# Patient Record
Sex: Female | Born: 1955 | Race: White | Hispanic: No | Marital: Married | Smoking: Never smoker
Health system: Southern US, Community
[De-identification: ages and names within clinical notes are randomized; demographics above are authoritative.]

## PROBLEM LIST (undated history)

## (undated) DIAGNOSIS — M67911 Unspecified disorder of synovium and tendon, right shoulder: Secondary | ICD-10-CM

## (undated) DIAGNOSIS — R5383 Other fatigue: Secondary | ICD-10-CM

## (undated) DIAGNOSIS — I10 Essential (primary) hypertension: Secondary | ICD-10-CM

## (undated) DIAGNOSIS — M1990 Unspecified osteoarthritis, unspecified site: Secondary | ICD-10-CM

## (undated) DIAGNOSIS — M549 Dorsalgia, unspecified: Secondary | ICD-10-CM

## (undated) DIAGNOSIS — G4700 Insomnia, unspecified: Secondary | ICD-10-CM

## (undated) DIAGNOSIS — F329 Major depressive disorder, single episode, unspecified: Secondary | ICD-10-CM

## (undated) DIAGNOSIS — M797 Fibromyalgia: Secondary | ICD-10-CM

## (undated) DIAGNOSIS — F419 Anxiety disorder, unspecified: Secondary | ICD-10-CM

## (undated) DIAGNOSIS — K469 Unspecified abdominal hernia without obstruction or gangrene: Secondary | ICD-10-CM

## (undated) DIAGNOSIS — M5136 Other intervertebral disc degeneration, lumbar region: Secondary | ICD-10-CM

## (undated) DIAGNOSIS — M75100 Unspecified rotator cuff tear or rupture of unspecified shoulder, not specified as traumatic: Secondary | ICD-10-CM

## (undated) DIAGNOSIS — M5030 Other cervical disc degeneration, unspecified cervical region: Secondary | ICD-10-CM

## (undated) DIAGNOSIS — F32A Depression, unspecified: Secondary | ICD-10-CM

## (undated) HISTORY — DX: Unspecified disorder of synovium and tendon, right shoulder: M67.911

## (undated) HISTORY — DX: Other fatigue: R53.83

## (undated) HISTORY — DX: Other cervical disc degeneration, unspecified cervical region: M50.30

## (undated) HISTORY — PX: HERNIA REPAIR: SHX51

## (undated) HISTORY — DX: Insomnia, unspecified: G47.00

## (undated) HISTORY — DX: Fibromyalgia: M79.7

## (undated) HISTORY — DX: Other intervertebral disc degeneration, lumbar region: M51.36

## (undated) HISTORY — PX: ABDOMINAL HYSTERECTOMY: SHX81

---

## 1987-01-16 HISTORY — PX: BACK SURGERY: SHX140

## 1997-12-23 ENCOUNTER — Other Ambulatory Visit: Admission: RE | Admit: 1997-12-23 | Discharge: 1997-12-23 | Payer: Self-pay | Admitting: Obstetrics and Gynecology

## 1999-06-06 ENCOUNTER — Other Ambulatory Visit: Admission: RE | Admit: 1999-06-06 | Discharge: 1999-06-06 | Payer: Self-pay | Admitting: Obstetrics and Gynecology

## 2001-01-15 HISTORY — PX: GASTRIC BYPASS: SHX52

## 2002-12-15 ENCOUNTER — Ambulatory Visit (HOSPITAL_COMMUNITY): Admission: RE | Admit: 2002-12-15 | Discharge: 2002-12-15 | Payer: Self-pay | Admitting: Unknown Physician Specialty

## 2006-03-26 ENCOUNTER — Ambulatory Visit (HOSPITAL_COMMUNITY): Admission: RE | Admit: 2006-03-26 | Discharge: 2006-03-26 | Payer: Self-pay | Admitting: Unknown Physician Specialty

## 2008-01-16 HISTORY — PX: NECK SURGERY: SHX720

## 2008-02-24 ENCOUNTER — Ambulatory Visit (HOSPITAL_COMMUNITY): Admission: RE | Admit: 2008-02-24 | Discharge: 2008-02-24 | Payer: Self-pay | Admitting: Obstetrics and Gynecology

## 2008-07-02 ENCOUNTER — Inpatient Hospital Stay (HOSPITAL_COMMUNITY): Admission: RE | Admit: 2008-07-02 | Discharge: 2008-07-04 | Payer: Self-pay | Admitting: Neurosurgery

## 2009-07-01 ENCOUNTER — Ambulatory Visit (HOSPITAL_COMMUNITY): Admission: RE | Admit: 2009-07-01 | Discharge: 2009-07-01 | Payer: Self-pay | Admitting: Internal Medicine

## 2009-08-31 ENCOUNTER — Encounter: Admission: RE | Admit: 2009-08-31 | Discharge: 2009-08-31 | Payer: Self-pay | Admitting: Neurosurgery

## 2010-02-04 ENCOUNTER — Encounter: Payer: Self-pay | Admitting: Unknown Physician Specialty

## 2010-03-31 ENCOUNTER — Other Ambulatory Visit (HOSPITAL_COMMUNITY): Payer: Self-pay | Admitting: Surgery

## 2010-03-31 ENCOUNTER — Encounter (HOSPITAL_COMMUNITY): Payer: 59

## 2010-03-31 ENCOUNTER — Other Ambulatory Visit: Payer: Self-pay | Admitting: Surgery

## 2010-03-31 ENCOUNTER — Ambulatory Visit (HOSPITAL_COMMUNITY)
Admission: RE | Admit: 2010-03-31 | Discharge: 2010-03-31 | Disposition: A | Discharge status: Home / Self Care | Payer: 59 | Source: Ambulatory Visit | Attending: Surgery | Admitting: Surgery

## 2010-03-31 DIAGNOSIS — Z01810 Encounter for preprocedural cardiovascular examination: Secondary | ICD-10-CM | POA: Insufficient documentation

## 2010-03-31 DIAGNOSIS — Z981 Arthrodesis status: Secondary | ICD-10-CM | POA: Insufficient documentation

## 2010-03-31 DIAGNOSIS — Z01812 Encounter for preprocedural laboratory examination: Secondary | ICD-10-CM | POA: Insufficient documentation

## 2010-03-31 DIAGNOSIS — Z01818 Encounter for other preprocedural examination: Secondary | ICD-10-CM | POA: Insufficient documentation

## 2010-03-31 DIAGNOSIS — K439 Ventral hernia without obstruction or gangrene: Secondary | ICD-10-CM | POA: Insufficient documentation

## 2010-03-31 LAB — BASIC METABOLIC PANEL
BUN: 10 mg/dL (ref 6–23)
CO2: 31 mEq/L (ref 19–32)
Calcium: 9.6 mg/dL (ref 8.4–10.5)
Chloride: 103 mEq/L (ref 96–112)
Creatinine, Ser: 0.89 mg/dL (ref 0.4–1.2)
GFR calc Af Amer: >60 mL/min (ref >60)
GFR calc non Af Amer: >60 mL/min (ref >60)
Glucose, Bld: 81 mg/dL (ref 70–99)
Potassium: 4 mEq/L (ref 3.5–5.1)
Sodium: 143 mEq/L (ref 135–145)

## 2010-03-31 LAB — CBC
HCT: 39.5 % (ref 36.0–46.0)
Hemoglobin: 13 g/dL (ref 12.0–15.0)
MCH: 28.6 pg (ref 26.0–34.0)
MCHC: 32.9 g/dL (ref 30.0–36.0)
MCV: 87 fL (ref 78.0–100.0)
Platelets: 243 10*3/uL (ref 150–400)
RBC: 4.54 MIL/uL (ref 3.87–5.11)
RDW: 13.1 % (ref 11.5–15.5)
WBC: 6.8 10*3/uL (ref 4.0–10.5)

## 2010-03-31 LAB — SURGICAL PCR SCREEN
MRSA, PCR: NEGATIVE
Staphylococcus aureus: NEGATIVE

## 2010-04-05 ENCOUNTER — Ambulatory Visit (HOSPITAL_COMMUNITY): Admit: 2010-04-05 | Payer: Self-pay | Admitting: Surgery

## 2010-04-05 ENCOUNTER — Ambulatory Visit (HOSPITAL_COMMUNITY)
Admission: RE | Admit: 2010-04-05 | Discharge: 2010-04-06 | Disposition: A | Discharge status: Home / Self Care | Payer: 59 | Source: Ambulatory Visit | Attending: Surgery | Admitting: Surgery

## 2010-04-05 DIAGNOSIS — K436 Other and unspecified ventral hernia with obstruction, without gangrene: Secondary | ICD-10-CM | POA: Insufficient documentation

## 2010-04-05 DIAGNOSIS — I10 Essential (primary) hypertension: Secondary | ICD-10-CM | POA: Insufficient documentation

## 2010-04-05 DIAGNOSIS — Z79899 Other long term (current) drug therapy: Secondary | ICD-10-CM | POA: Insufficient documentation

## 2010-04-05 DIAGNOSIS — Z9884 Bariatric surgery status: Secondary | ICD-10-CM | POA: Insufficient documentation

## 2010-04-24 LAB — CBC
HCT: 40.5 % (ref 36.0–46.0)
Hemoglobin: 14.3 g/dL (ref 12.0–15.0)
MCHC: 35.2 g/dL (ref 30.0–36.0)
MCV: 88.8 fL (ref 78.0–100.0)
Platelets: 271 10*3/uL (ref 150–400)
RBC: 4.57 MIL/uL (ref 3.87–5.11)
RDW: 12.5 % (ref 11.5–15.5)
WBC: 8.1 10*3/uL (ref 4.0–10.5)

## 2010-04-24 LAB — BASIC METABOLIC PANEL
BUN: 11 mg/dL (ref 6–23)
CO2: 32 mEq/L (ref 19–32)
Calcium: 9.8 mg/dL (ref 8.4–10.5)
Chloride: 100 mEq/L (ref 96–112)
Creatinine, Ser: 0.85 mg/dL (ref 0.4–1.2)
GFR calc Af Amer: >60 mL/min (ref >60)
GFR calc non Af Amer: >60 mL/min (ref >60)
Glucose, Bld: 136 mg/dL — ABNORMAL HIGH (ref 70–99)
Potassium: 3.9 mEq/L (ref 3.5–5.1)
Sodium: 140 mEq/L (ref 135–145)

## 2010-04-24 LAB — GLUCOSE, CAPILLARY: Glucose-Capillary: 197 mg/dL — ABNORMAL HIGH (ref 70–99)

## 2010-05-01 NOTE — Op Note (Signed)
Carrie [name], [name]:  [id number]  MEDICAL RECORD NO.:  [id number]           PATIENT TYPE:  O  LOCATION:  DAYL                         FACILITY:  [facility]  PHYSICIAN:  Thornton Park. 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  POSTOPERATIVE DIAGNOSIS:  Ventral hernia with a small bit of omentum incarcerated within the sac.  PROCEDURE:  Laparoscopic ventral hernia repair with 12 x 12 cm Parietex mesh placed laparoscopically.  SURGEON:  Thornton Park. Daphine Deutscher, M.D.  ASSISTANT:  None.  ANESTHESIA:  General endotracheal.  DESCRIPTION OF PROCEDURE:  This 55 year old white female was taken to OR 1 at [facility] on Wednesday, April 05, 2010.  The Foley was inserted and the abdomen was prepped with PCMX and draped sterilely.  I entered the abdomen after appropriate time-out through the left upper quadrant using a 0-degrees 5-mm Opti-Vu without difficulty.  The abdomen was insufflated.  Another 5-mm was placed in the left lower quadrant and then another one was placed over on the right side.  The defect was visualized and omentum was stuck up in the hernia.  I had to pull this down and then I was able to excise it and get it to reduce easily and completely.  This left a defect.  I went ahead and mapped that with the spinal needle and felt that I could adequately cover this with a 12 x 12 cm piece of Parietex mesh.  Sutures were placed on the inside of the Parietex mesh with the side that is coated on the opposite side where it would be exposed to the bowel contents.  This was then moistened and then inserted through a 10, which I placed through the middle of the hernia in an oblique fashion.  This was rolled in and deployed.  Next, I went in with the [facility] and placed four holes and I  went in and pulled up the sutures.  There was a little bit of a gusset laterally and I let it go back in on the right side and went out a little more laterally and went back in and grasped the suture and pulled it up. This covered the defect nicely.  I then used the tacker to get it tacked all around the perimeter of the defect.  I placed another full-thickness suture going through the 11-mm port going on the fascial edge to help tack it down superiorly.  It looked like it was in good position and well tacked with the absorbable tacker.  This was the secure strap stapler.  There was a little bit of bleeding from the right side, but this had stopped by the time everything was tied and secured.  The wounds were injected with 0.25% Marcaine.  The abdomen was deflated and trocars removed and the skin closed with 4-0 Vicryl with Dermabond.  An abdominal binder was placed.  The patient was taken to the recovery room in satisfactory condition.  Thornton Park Daphine Deutscher, MD     MBM/MEDQ  D:  04/05/2010  T:  04/05/2010  Job:  [id number]  cc:   Doreen Beam, MD Fax: [phone]  Electronically Signed by Luretha Murphy MD on 05/01/2010 09:26:44 AM

## 2010-05-30 NOTE — Op Note (Signed)
NAMEJEWELINE, REIF NO.:  [id number]   MEDICAL RECORD NO.:  [id number]          PATIENT TYPE:  INP   LOCATION:  3102                         FACILITY:  [facility]   PHYSICIAN:  Hilda Lias, M.D.   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   PROCEDURE:  C3-4, 4-5, 5-6, 6-7 decompression of the spinal cord,  removal of calcification of the posterior ligament.  Foraminotomy,  interbody fusion with graft and autograft, plate, microscope.   SURGEON:  Hilda Lias, MD   ASSISTANT:  Hewitt Shorts, MD   CLINICAL HISTORY:  Ms. Linders is a lady who had been complaining of neck  pain worsened to the left upper extremity associated with a burning  sensation in the left upper extremity and weakness.  Also, she has a  history of everytime she hyperextends her neck she develops spasms in  both upper extremities associated with burning sensation.  X-rays show  severe stenosis from C3 down to C6-7.  Surgery was advised.  She and her  husband knew the risks such as infection, CSF leak, paralysis,  infection, and need for surgery which might compromise a posterior  approach.   PROCEDURE:  The patient was taken to the OR, and after intubation the  left side of the neck was cleaned with DuraPrep.  Because she has a  short neck, it was impossible to do a transverse incision.  We did a  longitudinal incision through the skin, subcutaneous tissue, platysma,  down to the cervical spine.  X-rays showed that indeed we were at the  level of C3-4.  From then on, we opened the anterior ligament at 3-4,  and total gross diskectomies were achieved.  The same procedure was done  at the level of 4-5, 5-6, 6-7.  Then, we brought  the microscope into the  area and we started at the level of C6-7 where we drilled the posterior  part of the 6 and 7 vertebral bodies.  The posterior ligament was quite  calcified with attachment to the dura mater.  Dissection was carried  out, and at the end we had good decompression of the spinal cord as well  as both of C7 nerve root.  At the level of 4-5 and 5-6, we found the  same problem, but at this area the calcification was mostly going to the  left side from the midline.  Incision was made, and we had to dissect  carefully up to the point that there was no border between the dura  mater and the posterior ligament which was calcified.  At the end, we  had good decompression of the canal as well as the nerve root with the  foraminotomy.  At the level of 3-4, the problem was mostly going to the  right side where the patient had quite a bit of calcification.  Using  blunt dissection right at the level of takeoff at the C4 nerve root, we  opened the dura mater with CSF coming through.  Then, a piece of muscle  plus Gelfoam were used to block the area followed by Tisseel.  Then, we  continued our dissection with plain decompression of the spinal cord in  the C4 nerve root.  Valsalva maneuver was negative.  Then at the level  of C3-4, we introduced an allograft of size 7 mm and the other 3 levels  the allograft was of 7 mm.  Then, a plate using a threaded screw was  done.  Lateral cervical spine showed that the area between C3-C4 was  normal, and because of her size and the shoulder we were unable to see  below.  Nevertheless, the visual inspection showed that the plate and  screws were in good position.  Again, we  repeated the Valsalva maneuver was negative.  We did hemostasis for 10  minutes, and the area was completely dry and there was no need to put  any drain.  From then on, the area was irrigated and closed with Vicryl  and Steri-Strips.  The patient is going to go to  PACU.            ______________________________  Hilda Lias, M.D.     EB/MEDQ  D:  07/02/2008  T:  07/03/2008  Job:  [id number]

## 2010-06-02 NOTE — H&P (Signed)
NAMEMAIRE, GOVAN NO.:  [id number]   MEDICAL RECORD NO.:  [id number]          PATIENT TYPE:  AMB   LOCATION:  DAY                           FACILITY:  [facility]   PHYSICIAN:  Dalia Heading, M.D.  DATE OF BIRTH:  [birth year]   DATE OF ADMISSION:  DATE OF DISCHARGE:  LH                              HISTORY & PHYSICAL   CHIEF COMPLAINT:  Incisional hernia.   HISTORY OF PRESENT ILLNESS:  The patient is a 55 year old white female  who is referred for evaluation and treatment of a supraumbilical hernia.  She had a laparoscopic gastric bypass in the remote past and started  developing swelling superior to the umbilicus over a trocar site.  It is  made worse with straining.   PAST MEDICAL HISTORY:  Includes obesity, hypertension.   PAST SURGICAL HISTORY:  As noted above, hysterectomy, back surgery.   CURRENT MEDICATIONS:  Effexor, triamterene/hydrochlorothiazide,  Restoril, Flexeril p.r.n.   ALLERGIES:  No known drug allergies.   REVIEW OF SYSTEMS:  Noncontributory.   PHYSICAL EXAMINATION:  GENERAL:  The patient is a well-developed, well-  nourished white female in no acute distress.  LUNGS:  Clear to auscultation with equal breath sounds bilaterally.  HEART:  Examination reveals a regular rate and rhythm without S3,  S4,  or murmurs.  ABDOMEN:  Soft, nontender, nondistended.  No hepatosplenomegaly or  masses are noted.  A large reducible supraumbilical hernia is present  with a surgical scar overlying it.   IMPRESSION:  Incisional hernia.   PLAN:  The patient is scheduled for laparoscopic incisional  herniorrhaphy with mesh on May 14, 2007.  The risks and benefits of  the procedure including bleeding, infection, bowel injury, and the  possibility of an open procedure were fully explained to the patient,  who gave informed consent.      Dalia Heading, M.D.  Electronically Signed     MAJ/MEDQ  D:  04/17/2007  T:  04/17/2007  Job:  [id number]   cc:    Dr. Barnett Abu, [place]   [facility] Short Stay

## 2010-06-28 ENCOUNTER — Other Ambulatory Visit (HOSPITAL_COMMUNITY): Payer: Self-pay | Admitting: Internal Medicine

## 2010-06-28 DIAGNOSIS — Z139 Encounter for screening, unspecified: Secondary | ICD-10-CM

## 2010-07-07 ENCOUNTER — Ambulatory Visit (HOSPITAL_COMMUNITY): Payer: 59

## 2010-07-14 ENCOUNTER — Ambulatory Visit (HOSPITAL_COMMUNITY)
Admission: RE | Admit: 2010-07-14 | Discharge: 2010-07-14 | Disposition: A | Discharge status: Home / Self Care | Payer: 59 | Source: Ambulatory Visit | Attending: Internal Medicine | Admitting: Internal Medicine

## 2010-07-14 DIAGNOSIS — Z139 Encounter for screening, unspecified: Secondary | ICD-10-CM

## 2010-07-14 DIAGNOSIS — Z1231 Encounter for screening mammogram for malignant neoplasm of breast: Secondary | ICD-10-CM | POA: Insufficient documentation

## 2010-08-01 ENCOUNTER — Other Ambulatory Visit (HOSPITAL_COMMUNITY): Payer: Self-pay | Admitting: Orthopedic Surgery

## 2010-08-01 DIAGNOSIS — M25511 Pain in right shoulder: Secondary | ICD-10-CM

## 2010-08-07 ENCOUNTER — Ambulatory Visit (HOSPITAL_COMMUNITY)
Admission: RE | Admit: 2010-08-07 | Discharge: 2010-08-07 | Disposition: A | Discharge status: Home / Self Care | Payer: 59 | Source: Ambulatory Visit | Attending: Orthopedic Surgery | Admitting: Orthopedic Surgery

## 2010-08-07 DIAGNOSIS — M25511 Pain in right shoulder: Secondary | ICD-10-CM

## 2010-08-07 DIAGNOSIS — M25519 Pain in unspecified shoulder: Secondary | ICD-10-CM | POA: Insufficient documentation

## 2010-08-07 DIAGNOSIS — M67919 Unspecified disorder of synovium and tendon, unspecified shoulder: Secondary | ICD-10-CM | POA: Insufficient documentation

## 2010-08-07 DIAGNOSIS — M719 Bursopathy, unspecified: Secondary | ICD-10-CM | POA: Insufficient documentation

## 2010-08-07 DIAGNOSIS — M19019 Primary osteoarthritis, unspecified shoulder: Secondary | ICD-10-CM | POA: Insufficient documentation

## 2010-09-16 HISTORY — PX: SHOULDER SURGERY: SHX246

## 2010-10-09 ENCOUNTER — Encounter (HOSPITAL_BASED_OUTPATIENT_CLINIC_OR_DEPARTMENT_OTHER)
Admission: RE | Admit: 2010-10-09 | Discharge: 2010-10-09 | Disposition: A | Discharge status: Home / Self Care | Payer: 59 | Source: Ambulatory Visit | Attending: Internal Medicine | Admitting: Internal Medicine

## 2010-10-09 LAB — BASIC METABOLIC PANEL
BUN: 11 mg/dL (ref 6–23)
CO2: 31 mEq/L (ref 19–32)
Calcium: 9.6 mg/dL (ref 8.4–10.5)
Chloride: 100 mEq/L (ref 96–112)
Creatinine, Ser: 0.82 mg/dL (ref 0.50–1.10)
GFR calc Af Amer: >60 mL/min (ref >60)
GFR calc non Af Amer: >60 mL/min (ref >60)
Glucose, Bld: 233 mg/dL — ABNORMAL HIGH (ref 70–99)
Potassium: 3.7 mEq/L (ref 3.5–5.1)
Sodium: 142 mEq/L (ref 135–145)

## 2010-10-12 ENCOUNTER — Ambulatory Visit (HOSPITAL_BASED_OUTPATIENT_CLINIC_OR_DEPARTMENT_OTHER)
Admission: RE | Admit: 2010-10-12 | Discharge: 2010-10-12 | Disposition: A | Discharge status: Home / Self Care | Payer: 59 | Source: Ambulatory Visit | Attending: Orthopedic Surgery | Admitting: Orthopedic Surgery

## 2010-10-12 DIAGNOSIS — M899 Disorder of bone, unspecified: Secondary | ICD-10-CM | POA: Insufficient documentation

## 2010-10-12 DIAGNOSIS — F3289 Other specified depressive episodes: Secondary | ICD-10-CM | POA: Insufficient documentation

## 2010-10-12 DIAGNOSIS — F329 Major depressive disorder, single episode, unspecified: Secondary | ICD-10-CM | POA: Insufficient documentation

## 2010-10-12 DIAGNOSIS — M949 Disorder of cartilage, unspecified: Secondary | ICD-10-CM | POA: Insufficient documentation

## 2010-10-12 DIAGNOSIS — M67919 Unspecified disorder of synovium and tendon, unspecified shoulder: Secondary | ICD-10-CM | POA: Insufficient documentation

## 2010-10-12 DIAGNOSIS — M719 Bursopathy, unspecified: Secondary | ICD-10-CM | POA: Insufficient documentation

## 2010-10-12 DIAGNOSIS — M25819 Other specified joint disorders, unspecified shoulder: Secondary | ICD-10-CM | POA: Insufficient documentation

## 2010-10-12 DIAGNOSIS — Z01812 Encounter for preprocedural laboratory examination: Secondary | ICD-10-CM | POA: Insufficient documentation

## 2010-10-12 DIAGNOSIS — I10 Essential (primary) hypertension: Secondary | ICD-10-CM | POA: Insufficient documentation

## 2010-10-12 LAB — POCT I-STAT, CHEM 8
BUN: 11 mg/dL (ref 6–23)
Calcium, Ion: 1.17 mmol/L (ref 1.12–1.32)
Chloride: 102 mEq/L (ref 96–112)
Creatinine, Ser: 0.9 mg/dL (ref 0.50–1.10)
Glucose, Bld: 107 mg/dL — ABNORMAL HIGH (ref 70–99)
HCT: 38 % (ref 36.0–46.0)
Hemoglobin: 12.9 g/dL (ref 12.0–15.0)
Potassium: 3.5 mEq/L (ref 3.5–5.1)
Sodium: 140 mEq/L (ref 135–145)
TCO2: 27 mmol/L (ref 0–100)

## 2010-10-18 NOTE — Op Note (Signed)
  NAMELOGAN, VEGH NO.:  [id number]  MEDICAL RECORD NO.:  [id number]  LOCATION:                                 FACILITY:  PHYSICIAN:  Loreta Ave, M.D. DATE OF BIRTH:  Dec 21, 1955  DATE OF PROCEDURE:  10/12/2010 DATE OF DISCHARGE:                              OPERATIVE REPORT   PREOPERATIVE DIAGNOSES:  Right shoulder chronic impingement, partial tearing of rotator cuff, marked distal clavicle osteolysis.  POSTOPERATIVE DIAGNOSES:  Right shoulder chronic impingement, partial tearing of rotator cuff, marked distal clavicle osteolysis.  Complex tearing of superior labrum with some mild extension into biceps tendon. Abrasive changes, but no full-thickness tearing of the cuff.  PROCEDURE:  Right shoulder exam under anesthesia arthroscopy. Debridement of labrum and rotator cuff.  Bursectomy, acromioplasty, CA ligament release.  Excision of distal clavicle.  SURGEON:  Loreta Ave, MD  ASSISTANT:  Genene Churn. Barry Dienes, [place] who present throughout the entire case, necessary for timely completion of procedure.  ANESTHESIA:  General  BLOOD LOSS:  Minimal.  SPECIMENS:  None.  COMPLICATIONS:  None.  DRESSINGS:  Soft compressive with sling.  PROCEDURE:  The patient was brought to the operating room, placed on the operating table in supine position.  After adequate anesthesia had been obtained, shoulder examined.  A little stiffness, but basically full motion with stable shoulder.  Placed in beach-chair position on a shoulder positioner, prepped and draped in usual sterile fashion.  Three portals anterior, posterior, lateral.  Arthroscope was induced. Shoulder distended and inspected.  A complex tearing of labrum was placed on top and debrided to a stable surface.  A little into the biceps, but most of the biceps had thorough integrity.  Hypermobility, not a true SLAP lesion.  Fair amount of undersurface tearing in crescent region of the cuff, nothing  full-thickness.  Capsule, ligamentous structures, articular cartilage looked good.  Cannula redirected subacromially.  Type 3 acromion, marked reactive bursitis and abrasive change on top of the cuff, nothing full-thickness.  Bursa resected and cuff debrided.  Acromioplasty to type 1 acromion with shaver and high- speed burr releasing CA ligament.  Distal clavicle with marked grade 4 changes, cystic change, periarticular spurs. Periarticular spurs and lateral centimeter of clavicle resected. Adequacy of decompression was confirmed viewing from all portals. Instruments and fluid removed.  Portals are closed with nylon.  Sterile compressive dressing applied.  Anesthesia reversed.  Brought to the recovery room.  Tolerated surgery well.  No complications.     Loreta Ave, M.D.     DFM/MEDQ  D:  10/12/2010  T:  10/12/2010  Job:  [id number]  Electronically Signed by Mckinley Jewel M.D. on 10/18/2010 02:32:40 PM

## 2010-11-13 ENCOUNTER — Ambulatory Visit: Payer: 59 | Attending: Orthopedic Surgery | Admitting: Physical Therapy

## 2010-11-13 DIAGNOSIS — R5381 Other malaise: Secondary | ICD-10-CM | POA: Insufficient documentation

## 2010-11-13 DIAGNOSIS — Z96619 Presence of unspecified artificial shoulder joint: Secondary | ICD-10-CM | POA: Insufficient documentation

## 2010-11-13 DIAGNOSIS — M25619 Stiffness of unspecified shoulder, not elsewhere classified: Secondary | ICD-10-CM | POA: Insufficient documentation

## 2010-11-13 DIAGNOSIS — M25519 Pain in unspecified shoulder: Secondary | ICD-10-CM | POA: Insufficient documentation

## 2010-11-13 DIAGNOSIS — IMO0001 Reserved for inherently not codable concepts without codable children: Secondary | ICD-10-CM | POA: Insufficient documentation

## 2010-11-16 ENCOUNTER — Ambulatory Visit: Payer: 59 | Attending: Orthopedic Surgery | Admitting: Physical Therapy

## 2010-11-16 DIAGNOSIS — IMO0001 Reserved for inherently not codable concepts without codable children: Secondary | ICD-10-CM | POA: Insufficient documentation

## 2010-11-16 DIAGNOSIS — M25519 Pain in unspecified shoulder: Secondary | ICD-10-CM | POA: Insufficient documentation

## 2010-11-16 DIAGNOSIS — M25619 Stiffness of unspecified shoulder, not elsewhere classified: Secondary | ICD-10-CM | POA: Insufficient documentation

## 2010-11-16 DIAGNOSIS — R5381 Other malaise: Secondary | ICD-10-CM | POA: Insufficient documentation

## 2010-11-16 DIAGNOSIS — Z96619 Presence of unspecified artificial shoulder joint: Secondary | ICD-10-CM | POA: Insufficient documentation

## 2010-11-20 ENCOUNTER — Encounter: Payer: 59 | Admitting: Physical Therapy

## 2010-11-23 ENCOUNTER — Ambulatory Visit: Payer: 59 | Admitting: Physical Therapy

## 2010-11-28 ENCOUNTER — Encounter: Payer: 59 | Admitting: Physical Therapy

## 2010-11-30 ENCOUNTER — Encounter: Payer: 59 | Admitting: Physical Therapy

## 2011-01-03 ENCOUNTER — Other Ambulatory Visit: Payer: Self-pay | Admitting: Neurosurgery

## 2011-01-03 DIAGNOSIS — M542 Cervicalgia: Secondary | ICD-10-CM

## 2011-01-03 DIAGNOSIS — M549 Dorsalgia, unspecified: Secondary | ICD-10-CM

## 2011-01-11 ENCOUNTER — Ambulatory Visit
Admission: RE | Admit: 2011-01-11 | Discharge: 2011-01-11 | Disposition: A | Discharge status: Home / Self Care | Payer: 59 | Source: Ambulatory Visit | Attending: Neurosurgery | Admitting: Neurosurgery

## 2011-01-11 DIAGNOSIS — M549 Dorsalgia, unspecified: Secondary | ICD-10-CM

## 2011-01-11 DIAGNOSIS — M542 Cervicalgia: Secondary | ICD-10-CM

## 2011-01-11 MED ORDER — DIAZEPAM 5 MG PO TABS: 10.0000 mg | ORAL_TABLET | Freq: Once | ORAL | Status: DC

## 2011-01-11 MED ORDER — IOHEXOL 300 MG/ML  SOLN
10.0000 mL | Freq: Once | INTRAMUSCULAR | Status: AC | PRN
  Administered 2011-01-11: 10 mL via INTRATHECAL

## 2011-01-11 NOTE — Progress Notes (Signed)
1215  Resting comfortably.  Denies pain at present.  Taking po liquids w/o difficulty.  Reviewed discharge instructions w/ patient & w/ her husband.  1245  Ambulates w/ minimal assistance.  Gait steady.    1250  Discharged to home.  (Husband to drive).

## 2011-03-06 ENCOUNTER — Other Ambulatory Visit (HOSPITAL_COMMUNITY): Payer: Self-pay | Admitting: Orthopedic Surgery

## 2011-03-06 DIAGNOSIS — M75100 Unspecified rotator cuff tear or rupture of unspecified shoulder, not specified as traumatic: Secondary | ICD-10-CM

## 2011-03-06 DIAGNOSIS — M25511 Pain in right shoulder: Secondary | ICD-10-CM

## 2011-03-09 ENCOUNTER — Ambulatory Visit (HOSPITAL_COMMUNITY)
Admission: RE | Admit: 2011-03-09 | Discharge: 2011-03-09 | Disposition: A | Discharge status: Home / Self Care | Payer: 59 | Source: Ambulatory Visit | Attending: Orthopedic Surgery | Admitting: Orthopedic Surgery

## 2011-03-09 DIAGNOSIS — M75100 Unspecified rotator cuff tear or rupture of unspecified shoulder, not specified as traumatic: Secondary | ICD-10-CM

## 2011-03-09 DIAGNOSIS — M25519 Pain in unspecified shoulder: Secondary | ICD-10-CM | POA: Insufficient documentation

## 2011-03-09 DIAGNOSIS — M25511 Pain in right shoulder: Secondary | ICD-10-CM

## 2011-03-09 DIAGNOSIS — M25619 Stiffness of unspecified shoulder, not elsewhere classified: Secondary | ICD-10-CM | POA: Insufficient documentation

## 2011-03-09 MED ORDER — IOHEXOL 180 MG/ML  SOLN
9.0000 mL | Freq: Once | INTRAMUSCULAR | Status: AC | PRN
  Administered 2011-03-09: 9 mL via INTRA_ARTICULAR

## 2011-03-09 MED ORDER — GADOBENATE DIMEGLUMINE 529 MG/ML IV SOLN
0.1000 mL | Freq: Once | INTRAVENOUS | Status: AC | PRN
  Administered 2011-03-09: 0.1 mL via INTRAVENOUS

## 2011-03-09 NOTE — Procedures (Signed)
Technically successful [right] shoulder injection for MRI.  The immediate postinjection fluoroscopic images demonstrate [no abnormalities.]

## 2011-03-12 ENCOUNTER — Telehealth (HOSPITAL_COMMUNITY): Payer: Self-pay | Admitting: *Deleted

## 2011-03-12 NOTE — Telephone Encounter (Signed)
Post procedure follow up call.  Line busy x 2 attempts.

## 2011-04-06 NOTE — Pre-Procedure Instructions (Signed)
PT STATES SHE HAS HURT HER BACK AND WILL HAVE TO RESCHEDULE. TOLD HER TO CALL DR MURPHYS OFFICE

## 2011-04-11 ENCOUNTER — Encounter (HOSPITAL_COMMUNITY): Payer: Self-pay

## 2011-04-11 ENCOUNTER — Emergency Department (HOSPITAL_COMMUNITY)
Admission: EM | Admit: 2011-04-11 | Discharge: 2011-04-11 | Disposition: A | Discharge status: Home / Self Care | Payer: 59 | Attending: Emergency Medicine | Admitting: Emergency Medicine

## 2011-04-11 ENCOUNTER — Emergency Department (HOSPITAL_COMMUNITY): Payer: 59

## 2011-04-11 DIAGNOSIS — K5900 Constipation, unspecified: Secondary | ICD-10-CM | POA: Insufficient documentation

## 2011-04-11 DIAGNOSIS — K639 Disease of intestine, unspecified: Secondary | ICD-10-CM

## 2011-04-11 DIAGNOSIS — K6389 Other specified diseases of intestine: Secondary | ICD-10-CM | POA: Insufficient documentation

## 2011-04-11 DIAGNOSIS — R1012 Left upper quadrant pain: Secondary | ICD-10-CM | POA: Insufficient documentation

## 2011-04-11 DIAGNOSIS — R110 Nausea: Secondary | ICD-10-CM | POA: Insufficient documentation

## 2011-04-11 HISTORY — DX: Unspecified abdominal hernia without obstruction or gangrene: K46.9

## 2011-04-11 HISTORY — DX: Dorsalgia, unspecified: M54.9

## 2011-04-11 HISTORY — DX: Unspecified rotator cuff tear or rupture of unspecified shoulder, not specified as traumatic: M75.100

## 2011-04-11 LAB — CBC
HCT: 37.5 % (ref 36.0–46.0)
MCH: 28.9 pg (ref 26.0–34.0)
MCV: 85.2 fL (ref 78.0–100.0)
RDW: 12.8 % (ref 11.5–15.5)
WBC: 7.7 10*3/uL (ref 4.0–10.5)

## 2011-04-11 LAB — COMPREHENSIVE METABOLIC PANEL
AST: 21 U/L (ref 0–37)
CO2: 32 mEq/L (ref 19–32)
Calcium: 9.7 mg/dL (ref 8.4–10.5)
Creatinine, Ser: 0.83 mg/dL (ref 0.50–1.10)
GFR calc Af Amer: >90 mL/min (ref >90)
GFR calc non Af Amer: 79 mL/min — ABNORMAL LOW (ref >90)
Glucose, Bld: 121 mg/dL — ABNORMAL HIGH (ref 70–99)
Total Protein: 6.8 g/dL (ref 6.0–8.3)

## 2011-04-11 LAB — DIFFERENTIAL
Basophils Absolute: 0.1 10*3/uL (ref 0.0–0.1)
Eosinophils Absolute: 0.3 10*3/uL (ref 0.0–0.7)
Eosinophils Relative: 4 % (ref 0–5)
Lymphocytes Relative: 27 % (ref 12–46)
Monocytes Absolute: 0.6 10*3/uL (ref 0.1–1.0)

## 2011-04-11 LAB — URINALYSIS, ROUTINE W REFLEX MICROSCOPIC
Bilirubin Urine: NEGATIVE
Glucose, UA: NEGATIVE mg/dL
Hgb urine dipstick: NEGATIVE
Ketones, ur: NEGATIVE mg/dL
Protein, ur: NEGATIVE mg/dL

## 2011-04-11 MED ORDER — IOHEXOL 300 MG/ML  SOLN
100.0000 mL | Freq: Once | INTRAMUSCULAR | Status: AC | PRN
  Administered 2011-04-11: 100 mL via INTRAVENOUS

## 2011-04-11 NOTE — Discharge Instructions (Signed)
I suspect that your pain has been coming from pockets of gas that get trapped in an area called the splenic flexure. This can cause severe pain which gets better soon as the gas moves on. Please return to the emergency department if you have pain that does not get better. Otherwise, followup with your primary care doctor and with the gastroenterologist.  Abdominal Pain Abdominal pain can be caused by many things. Your caregiver decides the seriousness of your pain by an examination and possibly blood tests and X-rays. Many cases can be observed and treated at home. Most abdominal pain is not caused by a disease and will probably improve without treatment. However, in many cases, more time must pass before a clear cause of the pain can be found. Before that point, it may not be known if you need more testing, or if hospitalization or surgery is needed. HOME CARE INSTRUCTIONS   Do not take laxatives unless directed by your caregiver.   Take pain medicine only as directed by your caregiver.   Only take over-the-counter or prescription medicines for pain, discomfort, or fever as directed by your caregiver.   Try a clear liquid diet (broth, tea, or water) for as long as directed by your caregiver. Slowly move to a bland diet as tolerated.  SEEK IMMEDIATE MEDICAL CARE IF:   The pain does not go away.   You have a fever.   You keep throwing up (vomiting).   The pain is felt only in portions of the abdomen. Pain in the right side could possibly be appendicitis. In an adult, pain in the left lower portion of the abdomen could be colitis or diverticulitis.   You pass bloody or black tarry stools.  MAKE SURE YOU:   Understand these instructions.   Will watch your condition.   Will get help right away if you are not doing well or get worse.  Document Released: 10/11/2004 Document Revised: 12/21/2010 Document Reviewed: 08/20/2007 [facility] Patient Information 2012 [place], [place].

## 2011-04-11 NOTE — ED Notes (Signed)
Pt returned from CT. NAD.

## 2011-04-11 NOTE — ED Notes (Signed)
Pt c/o intermittent left sided abd pain for the past 3 weeks.  Says when pain comes it is severe and lasts approx .   Reports "little" nausea in the past 2 days but no vomiting or diarrhea.  LBM was today.  Pt says had 2 BMs today but says stool was hard today.  Denies any urinary symptoms.  Denies any vaginal bleeding or discharge.

## 2011-04-11 NOTE — ED Notes (Signed)
Pt reports history of hernia surgery in Sept.  Pt says area around the surgical area feels tender.

## 2011-04-11 NOTE — ED Provider Notes (Signed)
History     CSN: [id number]  Arrival date & time 04/11/11  1440   First MD Initiated Contact with Patient 04/11/11 1534      Chief Complaint  Patient presents with  . Abdominal Pain    (Consider location/radiation/quality/duration/timing/severity/associated sxs/prior treatment) Patient is a 56 y.o. female presenting with abdominal pain. The history is provided by the patient.  Abdominal Pain The primary symptoms of the illness include abdominal pain.  She has been having crampy abdominal pain for the last 2-3 weeks. She has difficulty localizing the pain but generally it seems to be in her upper abdomen. When present, pain is severe and she rates it at 10 out of 10. Pain will last for about 30 minutes before subsiding. It does not come on with any particular pattern, but when present, it is better if she is lying supine. There has been some mild nausea but no vomiting. She has chronic constipation which he treats with stool soft percent is unchanged. Pain is not changed by passing flatus or having a bowel movement. She's not had any fever, chills, sweats. She did have surgery for a ventral hernia about one year ago and is wondering if there is any connection between that surgery and her current pain. Symptoms have been getting worse in that pain has been more severe when episodes occur. Her most recent episode was earlier today, but pain is now completely resolved.  Past Medical History  Diagnosis Date  . Hernia   . Rotator cuff tear   . Back pain     OPLL    Past Surgical History  Procedure Date  . Neck surgery   . Hernia repair   . Abdominal hysterectomy   . Shoulder surgery   . Gastric bypass     No family history on file.  History  Substance Use Topics  . Smoking status: Never Smoker   . Smokeless tobacco: Not on file  . Alcohol Use: No    OB History    Grav Para Term Preterm Abortions TAB SAB Ect Mult Living                  Review of Systems    Gastrointestinal: Positive for abdominal pain.  All other systems reviewed and are negative.    Allergies  Dilaudid  Home Medications  No current outpatient prescriptions on file.  BP 140/80  Pulse 90  Temp(Src) 97.7 F (36.5 C) (Oral)  Resp 20  Ht 5' 7.25" (1.708 m)  Wt 250 lb (113.399 kg)  BMI 38.86 kg/m2  SpO2 99%  Physical Exam  Nursing note and vitals reviewed.  56 year old female who is resting comfortably and in no acute distress. Vital signs are normal. Oxygen saturation is 99% which is normal. She is moderately obese. Pupils are equal and reactive nonicteric movements are full. Head is normocephalic and atraumatic. Oropharynx is clear. There is no scleral icterus. Neck is nontender and supple without adenopathy or JVD. Lungs are clear without rales, wheezes, or rhonchi. Heart has regular rate and rhythm without murmur. Abdomen is soft and flat with moderate tenderness in the left upper quadrant. Is no rebound or guarding. There no masses or hepatosplenomegaly. Suture line is palpable in the midline in the upper abdomen consistent with past hernia repair. Extremities no cyanosis or edema, full range of motion is present. Skin is warm and dry without rash. Neurologic: Mental status is normal, cranial nerves are intact, there no focal motor or sensory deficits.  ED  Course  Procedures (including critical care time)  Results for orders placed during the hospital encounter of 04/11/11  URINALYSIS, ROUTINE W REFLEX MICROSCOPIC      Component Value Range   Color, Urine YELLOW  YELLOW    APPearance CLEAR  CLEAR    Specific Gravity, Urine 1.010  1.005 - 1.030    pH 7.0  5.0 - 8.0    Glucose, UA NEGATIVE  NEGATIVE (mg/dL)   Hgb urine dipstick NEGATIVE  NEGATIVE    Bilirubin Urine NEGATIVE  NEGATIVE    Ketones, ur NEGATIVE  NEGATIVE (mg/dL)   Protein, ur NEGATIVE  NEGATIVE (mg/dL)   Urobilinogen, UA 0.2  0.0 - 1.0 (mg/dL)   Nitrite NEGATIVE  NEGATIVE    Leukocytes, UA  NEGATIVE  NEGATIVE   CBC      Component Value Range   WBC 7.7  4.0 - 10.5 (K/uL)   RBC 4.40  3.87 - 5.11 (MIL/uL)   Hemoglobin 12.7  12.0 - 15.0 (g/dL)   HCT 16.1  09.6 - 04.5 (%)   MCV 85.2  78.0 - 100.0 (fL)   MCH 28.9  26.0 - 34.0 (pg)   MCHC 33.9  30.0 - 36.0 (g/dL)   RDW 40.9  81.1 - 91.4 (%)   Platelets 275  150 - 400 (K/uL)  DIFFERENTIAL      Component Value Range   Neutrophils Relative 61  43 - 77 (%)   Neutro Abs 4.6  1.7 - 7.7 (K/uL)   Lymphocytes Relative 27  12 - 46 (%)   Lymphs Abs 2.0  0.7 - 4.0 (K/uL)   Monocytes Relative 8  3 - 12 (%)   Monocytes Absolute 0.6  0.1 - 1.0 (K/uL)   Eosinophils Relative 4  0 - 5 (%)   Eosinophils Absolute 0.3  0.0 - 0.7 (K/uL)   Basophils Relative 1  0 - 1 (%)   Basophils Absolute 0.1  0.0 - 0.1 (K/uL)  COMPREHENSIVE METABOLIC PANEL      Component Value Range   Sodium 141  135 - 145 (mEq/L)   Potassium 3.6  3.5 - 5.1 (mEq/L)   Chloride 100  96 - 112 (mEq/L)   CO2 32  19 - 32 (mEq/L)   Glucose, Bld 121 (*) 70 - 99 (mg/dL)   BUN 12  6 - 23 (mg/dL)   Creatinine, Ser 7.82  0.50 - 1.10 (mg/dL)   Calcium 9.7  8.4 - 95.6 (mg/dL)   Total Protein 6.8  6.0 - 8.3 (g/dL)   Albumin 3.9  3.5 - 5.2 (g/dL)   AST 21  0 - 37 (U/L)   ALT 26  0 - 35 (U/L)   Alkaline Phosphatase 108  39 - 117 (U/L)   Total Bilirubin 0.3  0.3 - 1.2 (mg/dL)   GFR calc non Af Amer 79 (*) >90 (mL/min)   GFR calc Af Amer >90  >90 (mL/min)  LIPASE, BLOOD      Component Value Range   Lipase 18  11 - 59 (U/L)   Ct Abdomen Pelvis W Contrast  04/11/2011  *RADIOLOGY REPORT*  Clinical Data: Abdominal pain, past history gastric bypass  CT ABDOMEN AND PELVIS WITH CONTRAST  Technique:  Multidetector CT imaging of the abdomen and pelvis was performed following the standard protocol during bolus administration of intravenous contrast. Sagittal and coronal MPR images reconstructed from axial data set.  Contrast:  Dilute oral contrast. 100 ml Omnipaque 300 IV.  Comparison: None   Findings: Lung bases clear.  10 x 8 mm low attenuation left adrenal nodule compatible with adenoma. Probable small medial right lobe hepatic cyst 10 mm diameter image 35. Remainder of liver, spleen, pancreas, kidneys, and adrenal gland normal appearance. Prior gastric bypass with unremarkable stomach and small bowel loops. Normal appendix. Colon normal in appearance. Decompressed urinary bladder with surgical absence of uterus and normal-sized ovaries. No mass, adenopathy, free fluid or inflammatory process otherwise seen. Scattered normal-sized lymph nodes within small bowel mesentery. Scattered degenerative changes spine with a long segment of calcification in the posterior longitudinal ligament at the T12 and L1 levels. Question prior L4-L5 fusion. Spinal stenosis L3-L4, multifactorial.  IMPRESSION: Prior gastric bypass. Left adrenal adenoma. No acute intra-abdominal or intrapelvic abnormalities. Spinal stenosis L3-L4.  Original Report Authenticated By: Lollie Marrow, M.D.    She has remained pain-free in the emergency department. 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  1. Abdominal pain   2. Splenic flexure syndrome       MDM  Abdominal pain of uncertain cause. The intermittent nature suggests gas pains however severity is worse than would be expected. CT has been ordered to make sure there is no significant intra-abdominal pathology.        Dione Booze, MD 04/11/11 [id number]

## 2011-04-12 ENCOUNTER — Encounter (HOSPITAL_BASED_OUTPATIENT_CLINIC_OR_DEPARTMENT_OTHER): Admission: RE | Payer: Self-pay | Source: Ambulatory Visit

## 2011-04-12 ENCOUNTER — Ambulatory Visit (HOSPITAL_BASED_OUTPATIENT_CLINIC_OR_DEPARTMENT_OTHER): Admission: RE | Admit: 2011-04-12 | Payer: 59 | Source: Ambulatory Visit | Admitting: Orthopedic Surgery

## 2011-04-12 SURGERY — SHOULDER ARTHROSCOPY WITH ROTATOR CUFF REPAIR AND SUBACROMIAL DECOMPRESSION
Anesthesia: General | Laterality: Right

## 2011-07-30 ENCOUNTER — Encounter (HOSPITAL_BASED_OUTPATIENT_CLINIC_OR_DEPARTMENT_OTHER): Payer: Self-pay | Admitting: *Deleted

## 2011-07-30 NOTE — Progress Notes (Signed)
To come in for bmet-ekg Was here 9/12 for rt shoulder scope-now needs RCR

## 2011-07-31 ENCOUNTER — Encounter (HOSPITAL_BASED_OUTPATIENT_CLINIC_OR_DEPARTMENT_OTHER)
Admission: RE | Admit: 2011-07-31 | Discharge: 2011-07-31 | Disposition: A | Discharge status: Home / Self Care | Payer: 59 | Source: Ambulatory Visit | Attending: Orthopedic Surgery | Admitting: Orthopedic Surgery

## 2011-07-31 LAB — BASIC METABOLIC PANEL
GFR calc Af Amer: >90 mL/min (ref >90)
GFR calc non Af Amer: >90 mL/min (ref >90)
Potassium: 3.2 mEq/L — ABNORMAL LOW (ref 3.5–5.1)
Sodium: 140 mEq/L (ref 135–145)

## 2011-07-31 NOTE — Progress Notes (Signed)
Dr Jean Rosenthal given  Potassium results clear for surgery,no further orders

## 2011-08-01 NOTE — H&P (Signed)
Carrie [name]/[facility] [address]   [address] [place], Carrie [name] [id number] [phone] A Division of [facility]  Carrie [name], M.D.     Carrie [name], M.D.     Carrie [name], M.D. Carrie [name], M.D.    Carrie [name], M.D. Carrie [name], M.D. Carrie [name], D.O.          Carrie [name]. Carrie [name], Carrie [name]            Carrie [name], Carrie [name] [place], OPA-C   RE: Carrie [name], Carrie [name]   [id number]      DOB: 1955-04-20 PROGRESS NOTE: 03-06-11 Carrie [name] is a 56 year old who is 5 months status [name] right shoulder arthroscopy with rotator cuff repair subacromial decompression and distal clavicle resection. She was doing well until Sunday when she was fixing her hair and had excruciating right shoulder pain. Sharp pain, 5/10 intensity with spasm. She's had limited function of the motion and severe pain with certain position. Pain radiates to the posterior shoulder blade with mild numbness or tingling distally. No swelling or bruising. She did not hear a pop or tearing sensation.  She's tried Motrin ice and heat without relief.  She was seen by Dr. 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 She has history of cervical spine fusion. Past medical history medications allergies social history and family history reviewed and signed.  EXAMINATION: Right shoulder skin is intact. Tenderness to palpation in the anterior and lateral aspect of the right shoulder tenderness to palpation in the medial border of her right shoulder blade. She has flexion and abduction to 100 degrees with pain at extremes, extension 45 degrees external rotation 20 degrees internal rotation 45 degrees. Empty can test is negative for tear but produces pain and spasm. She has tenderness to palpation along the bicipital groove.  No tenderness to palpation along the [facility] joint. She's neurovascularly  intact.  IMPRESSION: 5 months status [name] right shoulder arthroscopy with rotator cuff repair subacromial decompression distal clavicle resection. 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 History of cervical spine fusion.  PLAN: We'll proceed with right shoulder MRI arthrogram to rule out rotator cuff tear. We proceed with cortisone injection today.   PROCEDURE NOTE: The patient's clinical condition is marked by substantial pain and/or significant functional disability. Other conservative therapy has not provided relief, is contraindicated, or not appropriate. There is a reasonable likelihood that injection will significantly improve the patient's pain and/or functional disability.  Patient is seated on the exam table, the posterior shoulder is prepped with Betadine and alcohol and injected into the subacromial interval with 1:4 Depo-Medrol/Marcaine.  Patient tolerates the procedure without difficulty.  Carrie [name], M.D.  Electronically verified by Carrie [name], M.D. DFM(JR):kh D 03-06-11 T 03-07-11  Carrie [name]/[facility] [address]   [address] [place] [name], Carrie [name] [id number] [phone] A Division of [facility]  Carrie [name], M.D.     Carrie [name], M.D.     Carrie [name], M.D. Carrie [name], M.D.    Carrie [name], M.D. Carrie [name], M.D. Carrie [name], D.O.          Carrie [name]. Carrie [name], Carrie [name]            Carrie [name], Carrie [name] [place], OPA-C   RE: Carrie [name], Carrie [name]   [id number]      DOB: [birth year] PROGRESS NOTE: 03-13-11 Carrie [name] comes  in for follow-up. Recent mild traumatic event worked up with MRI arthrogram. Complete tear of infraspinatus tendon. This is up near the top margin. This is surprising as her previous scope revealed a lot of partial tearing but I thought it was much more in the supraspinatus above and below. Adequate bony decompression distal clavicle excision. I have gone  over previous operative notes pictures and looked at her previous scan as well as the new scan and report. At this point in time this needs to be addressed and she understands. More than 25 minutes spent face-to-face with her covering workup and treatment to date. She understands the need to so something. She already has a good decompression. Plan is exam under anesthesia arthroscopy and rotator cuff repair hopefully arthroscopically. Discussed risks benefits and possible complications in detail with her. Paperwork complete. I'll see her at the time of operative intervention.  Carrie [name], M.D.  Electronically verified by Carrie [name], M.D. DFM:kh D 03-14-11 T 03-15-11  Carrie [name]/[facility] [address]   [address] [place] [name], Carrie [name] [address] [phone] A Division of [facility]  Carrie [name], M.D.     Carrie [name], M.D.     Carrie [name], M.D. Carrie [name], M.D.    Carrie [name], M.D. Carrie [name], M.D. Carrie [name]. Carrie [name], Carrie [name]            Carrie [name], Carrie [name] [place], OPA-C   RE: Carrie [name], Carrie [name]                                [id number]      DOB: 25-Sep-1955 PROGRESS NOTE: 07-31-11 Carrie [name] comes in for follow up.  She was scheduled for repair of a retracted infraspinatus tear tendon, right shoulder, back in February.  Because of numerous issues with her back she has put off all surgery.  She has rescheduled to proceed next week.  I had her come in for recheck.  Continues to be significantly symptomatic, especially with any activities that involve using the infraspinatus.  My biggest worry here is that this was already a significantly retracted tear in February, but without muscle atrophy.  It has now been five months later and I am very worried about being able to fix this.  I have opened a discussion with her, talked about this frankly and explained to her my concerns.  EXAMINATION: She has marked external  rotation weakness, but passively I can get her through full motion.  Although there is some atrophy in the infraspinatus to exam, it is not too dramatic.    DISPOSITION:  I remain very worried we are going to be able to repair the infraspinatus, but the sooner we try to do this the better.  Exam under anesthesia, arthroscopy, assessment of pathology and hopefully repair of the infraspinatus tendon.  Procedures, risks, benefits and complications reviewed with her again.  I have again reinforced that I might find that this is irreparable and I am simply doing debridement.  All questions answered.  More than 25 minutes spent face-to-face covering all of this with her.  I will see her at the time of operative intervention.     Carrie [name], M.D.   Electronically verified by Carrie [name], M.D. DFM:jjh D 07-31-11

## 2011-08-02 ENCOUNTER — Encounter (HOSPITAL_BASED_OUTPATIENT_CLINIC_OR_DEPARTMENT_OTHER): Payer: Self-pay | Admitting: *Deleted

## 2011-08-02 ENCOUNTER — Encounter (HOSPITAL_BASED_OUTPATIENT_CLINIC_OR_DEPARTMENT_OTHER): Payer: Self-pay | Admitting: Anesthesiology

## 2011-08-02 ENCOUNTER — Encounter (HOSPITAL_BASED_OUTPATIENT_CLINIC_OR_DEPARTMENT_OTHER)
Admission: RE | Disposition: A | Discharge status: Home / Self Care | Payer: Self-pay | Source: Ambulatory Visit | Attending: Orthopedic Surgery

## 2011-08-02 ENCOUNTER — Ambulatory Visit (HOSPITAL_BASED_OUTPATIENT_CLINIC_OR_DEPARTMENT_OTHER)
Admission: RE | Admit: 2011-08-02 | Discharge: 2011-08-02 | Disposition: A | Discharge status: Home / Self Care | Payer: 59 | Source: Ambulatory Visit | Attending: Orthopedic Surgery | Admitting: Orthopedic Surgery

## 2011-08-02 ENCOUNTER — Ambulatory Visit (HOSPITAL_BASED_OUTPATIENT_CLINIC_OR_DEPARTMENT_OTHER): Payer: 59 | Admitting: Anesthesiology

## 2011-08-02 DIAGNOSIS — Z01810 Encounter for preprocedural cardiovascular examination: Secondary | ICD-10-CM | POA: Insufficient documentation

## 2011-08-02 DIAGNOSIS — X500XXA Overexertion from strenuous movement or load, initial encounter: Secondary | ICD-10-CM | POA: Insufficient documentation

## 2011-08-02 DIAGNOSIS — F329 Major depressive disorder, single episode, unspecified: Secondary | ICD-10-CM | POA: Insufficient documentation

## 2011-08-02 DIAGNOSIS — F3289 Other specified depressive episodes: Secondary | ICD-10-CM | POA: Insufficient documentation

## 2011-08-02 DIAGNOSIS — I10 Essential (primary) hypertension: Secondary | ICD-10-CM | POA: Insufficient documentation

## 2011-08-02 DIAGNOSIS — Z981 Arthrodesis status: Secondary | ICD-10-CM | POA: Insufficient documentation

## 2011-08-02 DIAGNOSIS — Y998 Other external cause status: Secondary | ICD-10-CM | POA: Insufficient documentation

## 2011-08-02 DIAGNOSIS — F411 Generalized anxiety disorder: Secondary | ICD-10-CM | POA: Insufficient documentation

## 2011-08-02 DIAGNOSIS — Y92009 Unspecified place in unspecified non-institutional (private) residence as the place of occurrence of the external cause: Secondary | ICD-10-CM | POA: Insufficient documentation

## 2011-08-02 DIAGNOSIS — Z4789 Encounter for other orthopedic aftercare: Secondary | ICD-10-CM

## 2011-08-02 DIAGNOSIS — S43429A Sprain of unspecified rotator cuff capsule, initial encounter: Secondary | ICD-10-CM | POA: Insufficient documentation

## 2011-08-02 HISTORY — DX: Essential (primary) hypertension: I10

## 2011-08-02 HISTORY — DX: Depression, unspecified: F32.A

## 2011-08-02 HISTORY — DX: Major depressive disorder, single episode, unspecified: F32.9

## 2011-08-02 HISTORY — DX: Unspecified osteoarthritis, unspecified site: M19.90

## 2011-08-02 HISTORY — DX: Anxiety disorder, unspecified: F41.9

## 2011-08-02 LAB — POCT HEMOGLOBIN-HEMACUE: Hemoglobin: 13.2 g/dL (ref 12.0–15.0)

## 2011-08-02 SURGERY — ARTHROSCOPY, SHOULDER, WITH ROTATOR CUFF REPAIR
Anesthesia: General | Site: Shoulder | Laterality: Right | Wound class: Clean

## 2011-08-02 MED ORDER — FENTANYL CITRATE 0.05 MG/ML IJ SOLN
25.0000 ug | INTRAMUSCULAR | Status: DC | PRN
  Administered 2011-08-02: 25 ug via INTRAVENOUS
  Administered 2011-08-02: 50 ug via INTRAVENOUS
  Administered 2011-08-02 (×3): 25 ug via INTRAVENOUS

## 2011-08-02 MED ORDER — CEFAZOLIN SODIUM 1-5 GM-% IV SOLN: 1.0000 g | INTRAVENOUS | Status: DC

## 2011-08-02 MED ORDER — MIDAZOLAM HCL 2 MG/2ML IJ SOLN
1.0000 mg | INTRAMUSCULAR | Status: DC | PRN
  Administered 2011-08-02: 2 mg via INTRAVENOUS

## 2011-08-02 MED ORDER — PROPOFOL 10 MG/ML IV EMUL
INTRAVENOUS | Status: DC | PRN
  Administered 2011-08-02: 250 mg via INTRAVENOUS

## 2011-08-02 MED ORDER — CEFAZOLIN SODIUM-DEXTROSE 2-3 GM-% IV SOLR
2.0000 g | INTRAVENOUS | Status: AC
  Administered 2011-08-02: 2 g via INTRAVENOUS

## 2011-08-02 MED ORDER — METOCLOPRAMIDE HCL 5 MG/ML IJ SOLN: 10.0000 mg | Freq: Once | INTRAMUSCULAR | Status: DC | PRN

## 2011-08-02 MED ORDER — ROPIVACAINE HCL 5 MG/ML IJ SOLN
INTRAMUSCULAR | Status: DC | PRN
  Administered 2011-08-02: 12 mL

## 2011-08-02 MED ORDER — DEXAMETHASONE SODIUM PHOSPHATE 4 MG/ML IJ SOLN
INTRAMUSCULAR | Status: DC | PRN
  Administered 2011-08-02: 10 mg via INTRAVENOUS

## 2011-08-02 MED ORDER — LIDOCAINE HCL (CARDIAC) 20 MG/ML IV SOLN
INTRAVENOUS | Status: DC | PRN
  Administered 2011-08-02: 50 mg via INTRAVENOUS

## 2011-08-02 MED ORDER — FENTANYL CITRATE 0.05 MG/ML IJ SOLN
50.0000 ug | INTRAMUSCULAR | Status: DC | PRN
  Administered 2011-08-02: 100 ug via INTRAVENOUS

## 2011-08-02 MED ORDER — MIDAZOLAM HCL 2 MG/2ML IJ SOLN
0.5000 mg | Freq: Once | INTRAMUSCULAR | Status: AC | PRN
  Administered 2011-08-02: 1 mg via INTRAVENOUS

## 2011-08-02 MED ORDER — ONDANSETRON HCL 4 MG/2ML IJ SOLN
INTRAMUSCULAR | Status: DC | PRN
  Administered 2011-08-02: 4 mg via INTRAVENOUS

## 2011-08-02 MED ORDER — LIDOCAINE HCL 1 % IJ SOLN
INTRAMUSCULAR | Status: DC | PRN
  Administered 2011-08-02: 2 mL via INTRADERMAL

## 2011-08-02 MED ORDER — LACTATED RINGERS IV SOLN
INTRAVENOUS | Status: DC
  Administered 2011-08-02 (×3): via INTRAVENOUS

## 2011-08-02 MED ORDER — SODIUM CHLORIDE 0.9 % IR SOLN
Status: DC | PRN
  Administered 2011-08-02: 33000 mL

## 2011-08-02 SURGICAL SUPPLY — 71 items
ANCH SUT SWLK 19.1X5.5 CLS EL (Anchor) ×2 IMPLANT
ANCHOR PEEK SWIVEL LOCK 5.5 (Anchor) ×2 IMPLANT
APL SKNCLS STERI-STRIP NONHPOA (GAUZE/BANDAGES/DRESSINGS)
BENZOIN TINCTURE PRP APPL 2/3 (GAUZE/BANDAGES/DRESSINGS) IMPLANT
BLADE CUTTER GATOR 3.5 (BLADE) ×2 IMPLANT
BLADE CUTTER MENIS 5.5 (BLADE) IMPLANT
BLADE GREAT WHITE 4.2 (BLADE) ×2 IMPLANT
BLADE SURG 15 STRL LF DISP TIS (BLADE) IMPLANT
BLADE SURG 15 STRL SS (BLADE)
BUR OVAL 6.0 (BURR) ×2 IMPLANT
CANISTER OMNI JUG 16 LITER (MISCELLANEOUS) ×2 IMPLANT
CANISTER SUCTION 2500CC (MISCELLANEOUS) IMPLANT
CANNULA TWIST IN 8.25X7CM (CANNULA) ×1 IMPLANT
CLOTH BEACON ORANGE TIMEOUT ST (SAFETY) ×2 IMPLANT
DECANTER SPIKE VIAL GLASS SM (MISCELLANEOUS) IMPLANT
DRAPE OEC MINIVIEW 54X84 (DRAPES) IMPLANT
DRAPE STERI 35X30 U-POUCH (DRAPES) ×2 IMPLANT
DRAPE U-SHAPE 47X51 STRL (DRAPES) ×2 IMPLANT
DRAPE U-SHAPE 76X120 STRL (DRAPES) ×4 IMPLANT
DRSG PAD ABDOMINAL 8X10 ST (GAUZE/BANDAGES/DRESSINGS) ×2 IMPLANT
DURAPREP 26ML APPLICATOR (WOUND CARE) ×2 IMPLANT
ELECT MENISCUS 165MM 90D (ELECTRODE) ×2 IMPLANT
ELECT NDL TIP 2.8 STRL (NEEDLE) IMPLANT
ELECT NEEDLE TIP 2.8 STRL (NEEDLE) IMPLANT
ELECT REM PT RETURN 9FT ADLT (ELECTROSURGICAL) ×2
ELECTRODE REM PT RTRN 9FT ADLT (ELECTROSURGICAL) ×1 IMPLANT
GAUZE XEROFORM 1X8 LF (GAUZE/BANDAGES/DRESSINGS) ×2 IMPLANT
GLOVE BIOGEL PI IND STRL 8 (GLOVE) ×1 IMPLANT
GLOVE BIOGEL PI INDICATOR 8 (GLOVE) ×1
GLOVE ORTHO TXT STRL SZ7.5 (GLOVE) ×4 IMPLANT
GOWN PREVENTION PLUS XLARGE (GOWN DISPOSABLE) ×3 IMPLANT
GOWN STRL REIN 2XL XLG LVL4 (GOWN DISPOSABLE) ×2 IMPLANT
NDL SCORPION MULTI FIRE (NEEDLE) IMPLANT
NDL SUT 6 .5 CRC .975X.05 MAYO (NEEDLE) IMPLANT
NEEDLE MAYO TAPER (NEEDLE)
NEEDLE SCORPION MULTI FIRE (NEEDLE) ×2 IMPLANT
NS IRRIG 1000ML POUR BTL (IV SOLUTION) IMPLANT
PACK ARTHROSCOPY DSU (CUSTOM PROCEDURE TRAY) ×2 IMPLANT
PACK BASIN DAY SURGERY FS (CUSTOM PROCEDURE TRAY) ×2 IMPLANT
PASSER SUT SWANSON 36MM LOOP (INSTRUMENTS) IMPLANT
PENCIL BUTTON HOLSTER BLD 10FT (ELECTRODE) ×2 IMPLANT
SET ARTHROSCOPY TUBING (MISCELLANEOUS) ×2
SET ARTHROSCOPY TUBING LN (MISCELLANEOUS) ×1 IMPLANT
SLEEVE SCD COMPRESS KNEE MED (MISCELLANEOUS) ×1 IMPLANT
SLING ARM FOAM STRAP LRG (SOFTGOODS) IMPLANT
SLING ARM FOAM STRAP MED (SOFTGOODS) IMPLANT
SLING ARM FOAM STRAP XLG (SOFTGOODS) IMPLANT
SLING ARM IMMOBILIZER LRG (SOFTGOODS) IMPLANT
SLING ARM IMMOBILIZER MED (SOFTGOODS) IMPLANT
SPONGE GAUZE 4X4 12PLY (GAUZE/BANDAGES/DRESSINGS) ×4 IMPLANT
SPONGE LAP 4X18 X RAY DECT (DISPOSABLE) IMPLANT
STRIP CLOSURE SKIN 1/2X4 (GAUZE/BANDAGES/DRESSINGS) IMPLANT
SUCTION FRAZIER TIP 10 FR DISP (SUCTIONS) IMPLANT
SUT ETHIBOND 2 OS 4 DA (SUTURE) IMPLANT
SUT ETHILON 2 0 FS 18 (SUTURE) IMPLANT
SUT ETHILON 3 0 PS 1 (SUTURE) ×1 IMPLANT
SUT FIBERWIRE #2 38 T-5 BLUE (SUTURE)
SUT RETRIEVER MED (INSTRUMENTS) IMPLANT
SUT STEEL 4 (SUTURE) IMPLANT
SUT STEEL 5 (SUTURE) IMPLANT
SUT TIGER TAPE 7 IN WHITE (SUTURE) IMPLANT
SUT VIC AB 0 CT1 27 (SUTURE)
SUT VIC AB 0 CT1 27XBRD ANBCTR (SUTURE) IMPLANT
SUT VIC AB 2-0 SH 27 (SUTURE)
SUT VIC AB 2-0 SH 27XBRD (SUTURE) IMPLANT
SUT VIC AB 3-0 FS2 27 (SUTURE) IMPLANT
SUTURE FIBERWR #2 38 T-5 BLUE (SUTURE) IMPLANT
TAPE FIBER 2MM 7IN #2 BLUE (SUTURE) ×2 IMPLANT
TOWEL OR 17X24 6PK STRL BLUE (TOWEL DISPOSABLE) ×2 IMPLANT
WATER STERILE IRR 1000ML POUR (IV SOLUTION) ×2 IMPLANT
YANKAUER SUCT BULB TIP NO VENT (SUCTIONS) IMPLANT

## 2011-08-02 NOTE — Transfer of Care (Signed)
Immediate Anesthesia Transfer of Care Note  Patient: Carrie [name]  Procedure(s) Performed: Procedure(s) (LRB): SHOULDER ARTHROSCOPY WITH ROTATOR CUFF REPAIR (Right)  Patient Location: PACU  Anesthesia Type: General and GA combined with regional for post-op pain  Level of Consciousness: awake and alert   Airway & Oxygen Therapy: Patient Spontanous Breathing and Patient connected to face mask oxygen  Post-op Assessment: Report given to PACU RN and Post -op Vital signs reviewed and stable  Post vital signs: Reviewed and stable  Complications: No apparent anesthesia complications

## 2011-08-02 NOTE — Anesthesia Procedure Notes (Addendum)
Anesthesia Regional Block:  Interscalene brachial plexus block  Pre-Anesthetic Checklist: ,, timeout performed, Correct Patient, Correct Site, Correct Laterality, Correct Procedure, Correct Position, site marked, Risks and benefits discussed,  Surgical consent,  Pre-op evaluation,  At surgeon's request and post-op pain management  Laterality: Right  Prep: chloraprep       Needles:   Needle Type: Other   (Arrow Echogenic)   Needle Length: 9cm  Needle Gauge: 21    Additional Needles:  Procedures: ultrasound guided Interscalene brachial plexus block Narrative:  Start time: 08/02/2011 7:12 AM End time: 08/02/2011 7:18 AM Injection made incrementally with aspirations every 5 mL.  Performed by: Personally  Anesthesiologist: Aldona Lento, MD  Additional Notes: Ultrasound guidance used to: id relevant anatomy, confirm needle position, local anesthetic spread, avoidance of vascular puncture. Picture saved. No complications. Block performed personally by Janetta Hora. Gelene Mink, MD    Interscalene brachial plexus block Procedure Name: Intubation Date/Time: 08/02/2011 8:24 AM Performed by: Caren Macadam Pre-anesthesia Checklist: Patient identified, Emergency Drugs available, Suction available and Patient being monitored Patient Re-evaluated:Patient Re-evaluated prior to inductionOxygen Delivery Method: Circle System Utilized Preoxygenation: Pre-oxygenation with 100% oxygen Intubation Type: IV induction Ventilation: Mask ventilation without difficulty Laryngoscope Size: Miller and 2 Grade View: Grade II Tube type: Oral Tube size: 8.0 mm Number of attempts: 1 Airway Equipment and Method: stylet and oral airway Placement Confirmation: ETT inserted through vocal cords under direct vision,  positive ETCO2 and breath sounds checked- equal and bilateral Secured at: 22 cm Tube secured with: Tape Dental Injury: Teeth and Oropharynx as per pre-operative assessment

## 2011-08-02 NOTE — Anesthesia Postprocedure Evaluation (Signed)
Anesthesia Post Note  Patient: Carrie [name]  Procedure(s) Performed: Procedure(s) (LRB): SHOULDER ARTHROSCOPY WITH ROTATOR CUFF REPAIR (Right)  Anesthesia type: General  Patient location: PACU  Post pain: Pain level controlled  Post assessment: Patient's Cardiovascular Status Stable  Last Vitals:  Filed Vitals:   08/02/11 1230  BP: 131/69  Pulse:   Temp:   Resp:     Post vital signs: Reviewed and stable  Level of consciousness: alert  Complications: No apparent anesthesia complications

## 2011-08-02 NOTE — Op Note (Signed)
NAMEKINSLEY, HOLDERMAN NO.:  [id number]  MEDICAL RECORD NO.:  [id number]  LOCATION:                                 FACILITY:  PHYSICIAN:  Loreta Ave, M.D. DATE OF BIRTH:  Aug 14, 1955  DATE OF PROCEDURE:  08/02/2011 DATE OF DISCHARGE:                              OPERATIVE REPORT   PREOPERATIVE DIAGNOSES:  Right shoulder complete retracted tear, infraspinatus tendon.  Previous arthroscopic debridement, decompression, distal clavicle excision done last year.  POSTOPERATIVE DIAGNOSES:  Right shoulder complete retracted tear, infraspinatus tendon.  Previous arthroscopic debridement, decompression, distal clavicle excision done last year.  There was a little bit of partial tearing on the supraspinatus above and below and long head biceps tendon.  Infraspinatus complete tear at the musculotendinous junction with marked retraction, irreparable, repair of the muscle injury, but the underlying tendon still intact.  PROCEDURE:  Right shoulder exam under anesthesia, arthroscopy. Debridement of supraspinatus, above below.  Assessment of previous decompression, found to be adequate.  Posterior repair with advancement of the top part of the injury, back portion of supraspinatus tendon brought down over top of the defect in the infraspinatus.  The teres minor advanced up from the bottom and these were both repaired side-to- side and then anchored laterally.  Two fiber weave sutures, two swivel lock anchors.  This allowed for transfer, closure of the posterior defect restoring muscle control to allow external rotation.  SURGEON:  Loreta Ave, MD.  ASSISTANT:  Genene Churn. Barry Dienes, PA present throughout the entire case and necessary for timely completion of the procedure.  ANESTHESIA:  General.  BLOOD LOSS:  Minimal.  SPECIMENS:  None.  CULTURES:  None.  COMPLICATIONS:  None.  DRESSINGS:  Soft compressive with shoulder immobilizer.  PROCEDURE IN DETAIL:  The  patient was brought to the operating room and placed in the operating table in supine position.  After adequate anesthesia had been obtained, right shoulder was examined.  Full motion, stable shoulder.  Placed in beach-chair position on the shoulder positioner, prepped and draped in usual sterile fashion.  Initially, 3 portals anterior, posterior, and lateral.  Arthroscope introduced, shoulder distended and inspected.  From within, the back wall of the shoulder was still intact as the tendinous and capsular layer were still intact each side.  Partial tearing of the supraspinatus out near the crescent region debrided.  Little partial tearing of the long head biceps debrided.  Articular cartilage, remaining labrum intact.  Cannula redirected subacromially.  Roughening on the top of the supraspinatus debrided.  Adequate bony decompression of the distal clavicle excision confirmed.  Looking at the back of the cuff, the muscle had ripped off the infraspinatus, retracted well medial to the glenoid.  This was nothing I could really advance or bring out further.  What was left to the tendon on the capsule was thin, but still connected.  I was able to mobilize the back of the supraspinatus in order to advance it down as well as the top of the tear was moderate to advance it up.  Two fiber weave sutures were placed in a horizontal mattress pattern in the supraspinatus and teres minor.  One  was medially on the lateral.  These were brought out to the lateral side and then anchored at two separate docking tunnels with 3 punched swivel lock anchors.  This allowed transfer of the tendons to close the defect, cover the back of the shoulder, and hopefully allow for external rotation.  Nice firm closure achieved.  I ended up adding a fourth posterolateral portals to this. Instruments and fluid were removed.  Portals were closed with nylon. Sterile compressive dressing applied.  Sling applied.   Anesthesia reversed.  Brought to the recovery room.  Tolerated the surgery well. No complications.     Loreta Ave, M.D.     DFM/MEDQ  D:  08/02/2011  T:  08/02/2011  Job:  [id number]

## 2011-08-02 NOTE — Interval H&P Note (Signed)
History and Physical Interval Note:  08/02/2011 7:37 AM  [name]  has presented today for surgery, with the diagnosis of Right shoulder Impingement Syndrome, Degenerative Arthristis, A/C joint, complete rupture of rotator cuff  The various methods of treatment have been discussed with the patient and family. After consideration of risks, benefits and other options for treatment, the patient has consented to  Procedure(s) (LRB): SHOULDER ARTHROSCOPY WITH ROTATOR CUFF REPAIR (Right) as a surgical intervention .  The patient's history has been reviewed, patient examined, no change in status, stable for surgery.  I have reviewed the patients' chart and labs.  Questions were answered to the patient's satisfaction.     Carrie [name] F

## 2011-08-02 NOTE — Anesthesia Preprocedure Evaluation (Signed)
Anesthesia Evaluation  Patient identified by MRN, date of birth, ID band Patient awake    Reviewed: Allergy & Precautions, H&P , NPO status , Patient's Chart, lab work & pertinent test results, reviewed documented beta blocker date and time   Airway Mallampati: II TM Distance: >3 FB Neck ROM: full    Dental   Pulmonary neg pulmonary ROS,  breath sounds clear to auscultation        Cardiovascular hypertension, Pt. on medications Rhythm:regular     Neuro/Psych PSYCHIATRIC DISORDERS Anxiety Depression  Neuromuscular disease    GI/Hepatic negative GI ROS, Neg liver ROS,   Endo/Other  Morbid obesity  Renal/GU negative Renal ROS  negative genitourinary   Musculoskeletal   Abdominal   Peds  Hematology negative hematology ROS (+)   Anesthesia Other Findings See surgeon's H&P   Reproductive/Obstetrics negative OB ROS                           Anesthesia Physical Anesthesia Plan  ASA: III  Anesthesia Plan: General   Post-op Pain Management:    Induction: Intravenous  Airway Management Planned: Oral ETT  Additional Equipment:   Intra-op Plan:   Post-operative Plan: Extubation in OR  Informed Consent: I have reviewed the patients History and Physical, chart, labs and discussed the procedure including the risks, benefits and alternatives for the proposed anesthesia with the patient or authorized representative who has indicated his/her understanding and acceptance.   Dental Advisory Given  Plan Discussed with: CRNA and Surgeon  Anesthesia Plan Comments:         Anesthesia Quick Evaluation

## 2011-08-02 NOTE — Brief Op Note (Signed)
08/02/2011  10:29 AM  PATIENT:  Carrie [name]  56 y.o. female  PRE-OPERATIVE DIAGNOSIS:  Right shoulder Impingement Syndrome, Degenerative Arthristis AC joint, complete rupture of rotator cuff  POST-OPERATIVE DIAGNOSIS:  Right shoulder Impingement Syndrome, Degenerative Arthristis AC joint, complete rupture of rotator cuff  PROCEDURE:  Procedure(s) (LRB): SHOULDER ARTHROSCOPY WITH arthroscopic ROTATOR CUFF REPAIR (Right)  SURGEON:  Surgeon(s) and Role:    * Loreta Ave, MD - Primary  PHYSICIAN ASSISTANT: Zonia Kief M     ANESTHESIA:   regional and general  EBL:  Total I/O In: 1400 [I.V.:1400] Out: -   SPECIMEN:  No Specimen  DISPOSITION OF SPECIMEN:  N/A  COUNTS:  YES  TOURNIQUET:  * No tourniquets in log *  PATIENT DISPOSITION:  PACU - hemodynamically stable.

## 2011-08-02 NOTE — Progress Notes (Signed)
Assisted Dr. Frederick with right, ultrasound guided, interscalene  block. Side rails up, monitors on throughout procedure. See vital signs in flow sheet. Tolerated Procedure well. 

## 2011-12-20 ENCOUNTER — Other Ambulatory Visit (HOSPITAL_COMMUNITY): Payer: Self-pay | Admitting: Internal Medicine

## 2011-12-20 DIAGNOSIS — Z139 Encounter for screening, unspecified: Secondary | ICD-10-CM

## 2011-12-24 ENCOUNTER — Ambulatory Visit (HOSPITAL_COMMUNITY): Payer: 59

## 2012-06-16 ENCOUNTER — Other Ambulatory Visit (HOSPITAL_COMMUNITY): Payer: Self-pay | Admitting: Internal Medicine

## 2012-06-16 DIAGNOSIS — Z139 Encounter for screening, unspecified: Secondary | ICD-10-CM

## 2012-06-17 ENCOUNTER — Ambulatory Visit (HOSPITAL_COMMUNITY)
Admission: RE | Admit: 2012-06-17 | Discharge: 2012-06-17 | Disposition: A | Discharge status: Home / Self Care | Payer: BC Managed Care – PPO | Source: Ambulatory Visit | Attending: Internal Medicine | Admitting: Internal Medicine

## 2012-06-17 DIAGNOSIS — Z1231 Encounter for screening mammogram for malignant neoplasm of breast: Secondary | ICD-10-CM | POA: Insufficient documentation

## 2012-06-17 DIAGNOSIS — Z139 Encounter for screening, unspecified: Secondary | ICD-10-CM

## 2012-09-28 IMAGING — CT CT ABD-PELV W/ CM
2 of 5 series · 17 of 46 positions shown, 19 images · IV contrast (Omnipaque 300)
Comparison: None

CLINICAL DATA: Abdominal pain, past history gastric bypass

CT ABDOMEN AND PELVIS WITH CONTRAST
TECHNIQUE: Multidetector CT imaging of the abdomen and pelvis was
performed following the standard protocol during bolus
administration of intravenous contrast. Sagittal and coronal MPR
images reconstructed from axial data set.
Contrast:  Dilute oral contrast. 100 ml Omnipaque 300 IV.

[Series 2: abd_pel_with 5.0 b40f · axial · 0.75mm/px · z∈[-498,-74]mm · 14 of 99 slices shown, 16 images]
[im 7/99  soft-tissue]
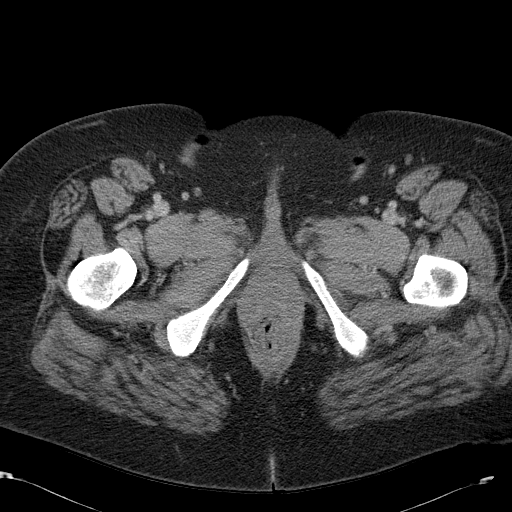
[im 7/99  bone]
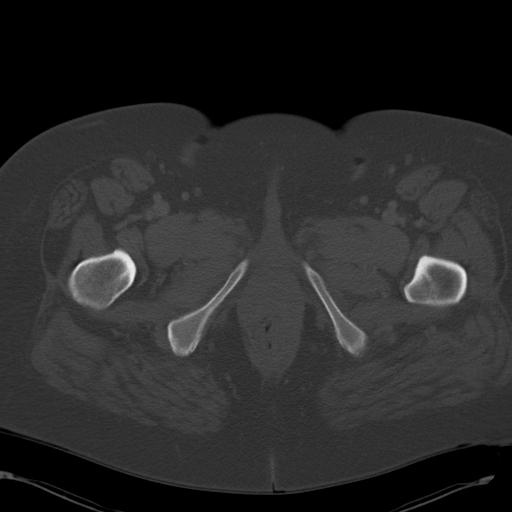
[im 14/99  soft-tissue]
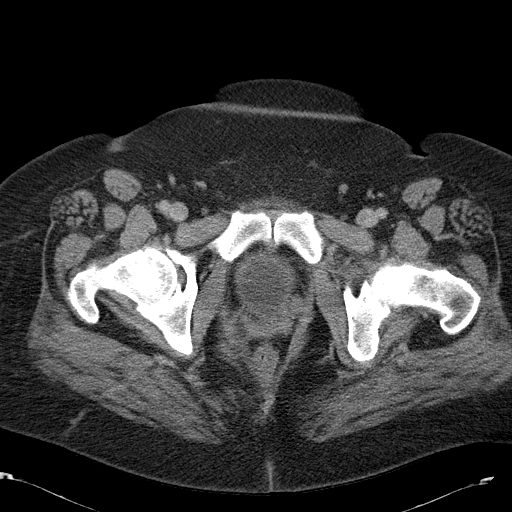
[im 20/99  soft-tissue]
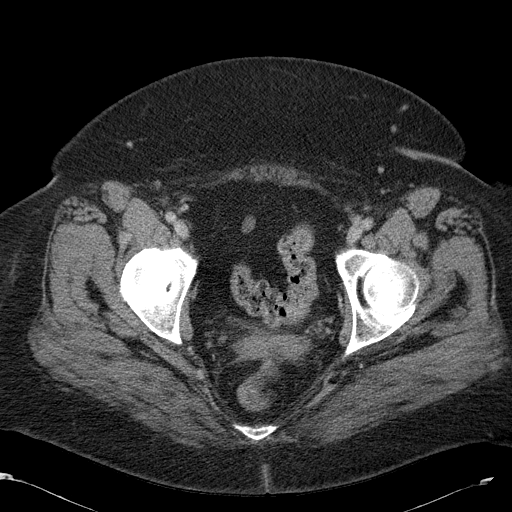
[im 27/99  soft-tissue]
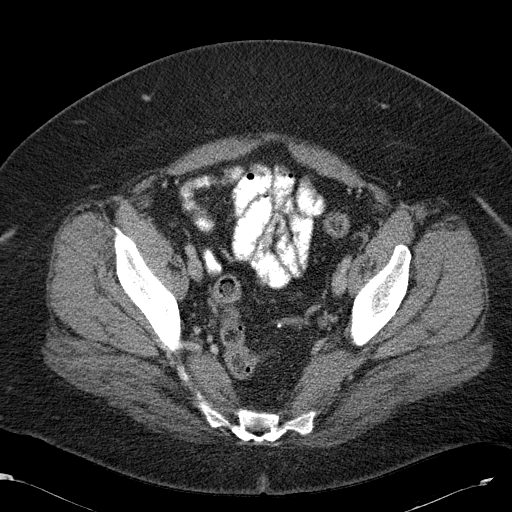
[im 33/99  soft-tissue]
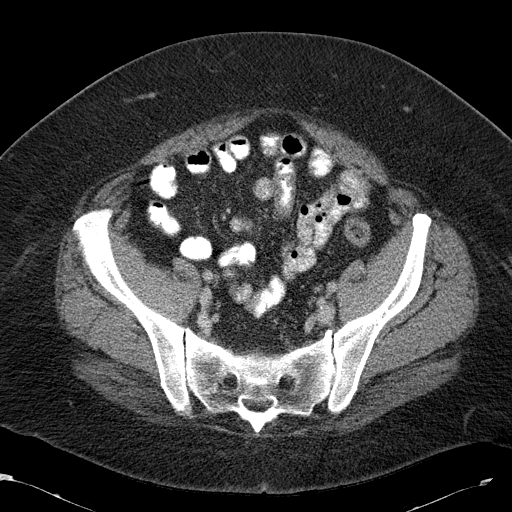
[im 40/99  soft-tissue]
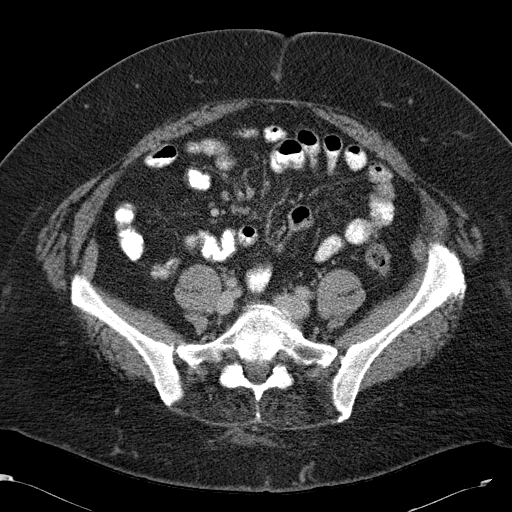
[im 46/99  soft-tissue]
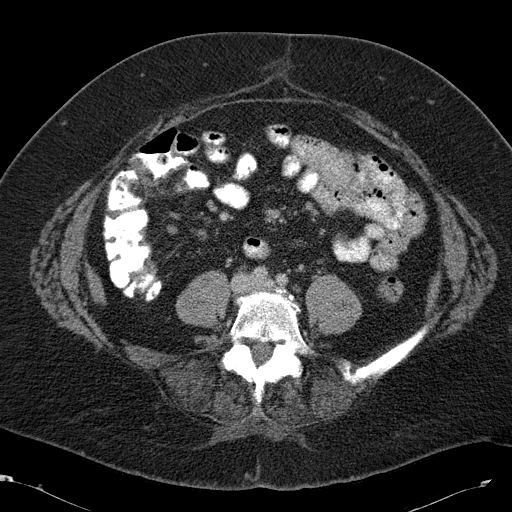
[im 53/99  soft-tissue]
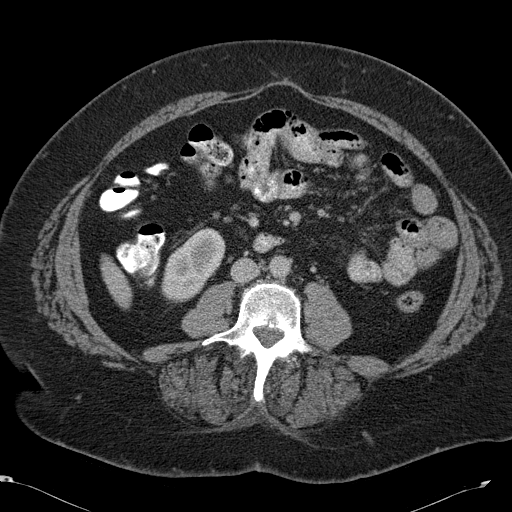
[im 59/99  soft-tissue]
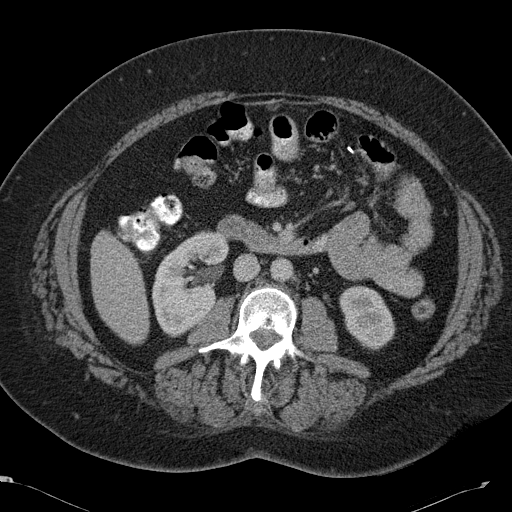
[im 59/99  bone]
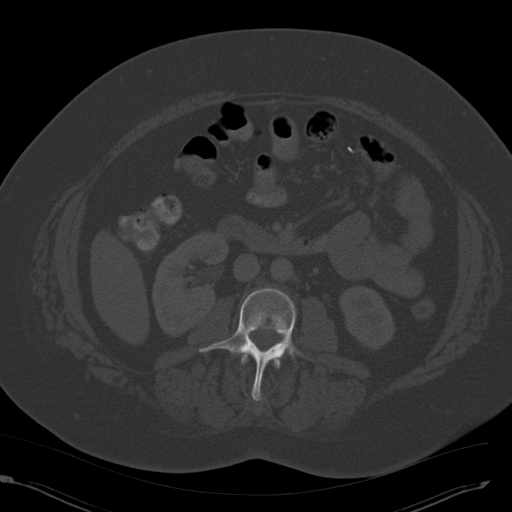
[im 66/99  soft-tissue]
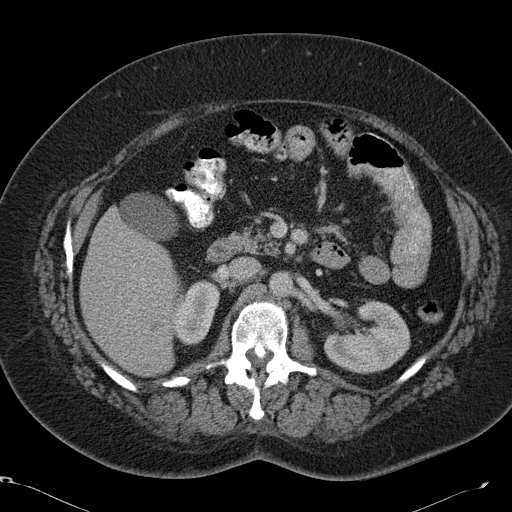
[im 72/99  soft-tissue]
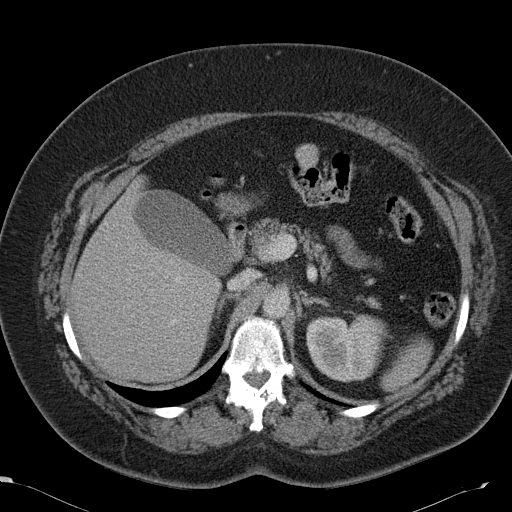
[im 79/99  soft-tissue]
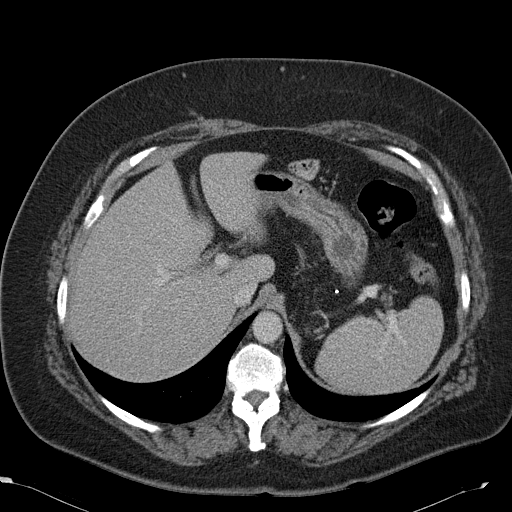
[im 85/99  soft-tissue]
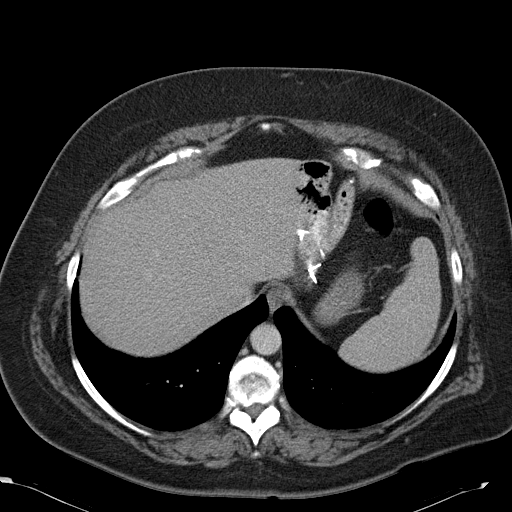
[im 92/99  soft-tissue]
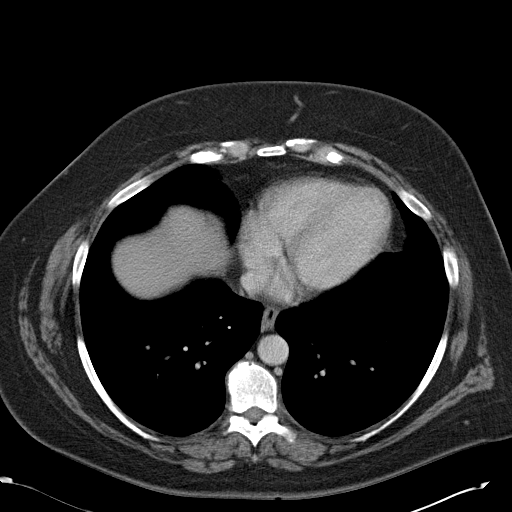

[Series 4: abd_pel_with 3.0 spo cor · coronal · 0.83mm/px · 3 of 101 slices shown]
[im 34/101  soft-tissue]
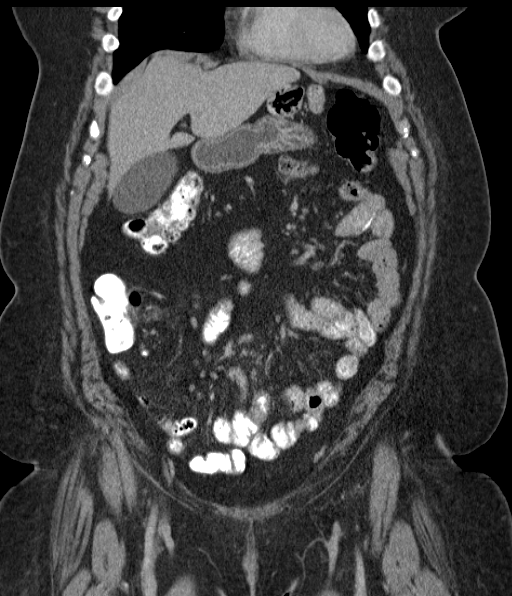
[im 45/101  soft-tissue]
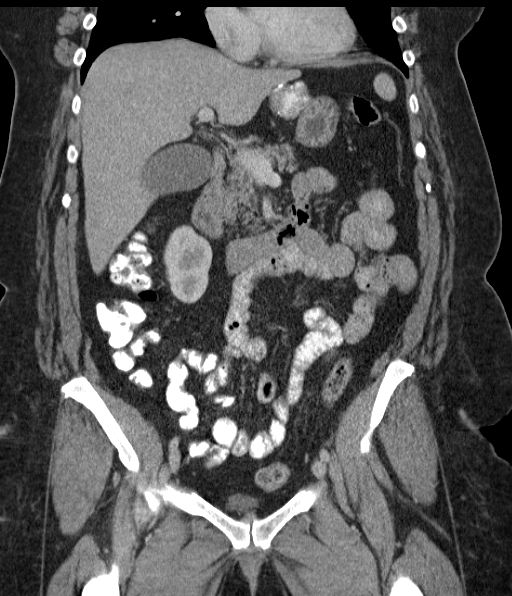
[im 56/101  soft-tissue]
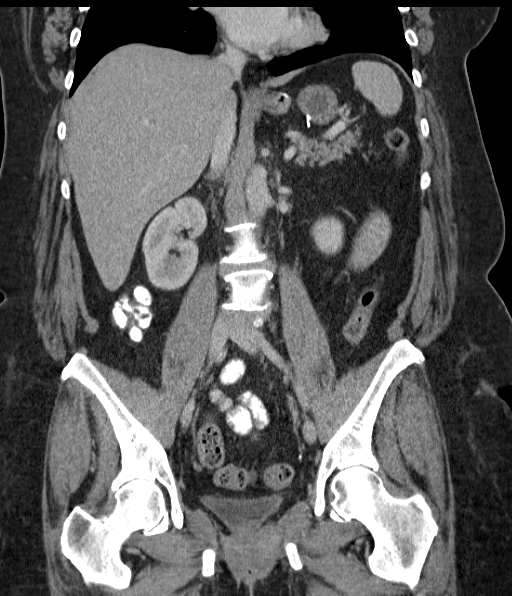

[17 of 46 positions shown; findings below may reference images not displayed]

FINDINGS: Lung bases clear.
10 x 8 mm low attenuation left adrenal nodule compatible with
adenoma.
Probable small medial right lobe hepatic cyst 10 mm diameter image
35.
Remainder of liver, spleen, pancreas, kidneys, and adrenal gland
normal appearance.
Prior gastric bypass with unremarkable stomach and small bowel
loops.
Normal appendix.
Colon normal in appearance.
Decompressed urinary bladder with surgical absence of uterus and
normal-sized ovaries.
No mass, adenopathy, free fluid or inflammatory process otherwise
seen.
Scattered normal-sized lymph nodes within small bowel mesentery.
Scattered degenerative changes spine with a long segment of
calcification in the posterior longitudinal ligament at the T12 and
L1 levels.
Question prior L4-L5 fusion.
Spinal stenosis L3-L4, multifactorial.
IMPRESSION: Prior gastric bypass.
Left adrenal adenoma.
No acute intra-abdominal or intrapelvic abnormalities.
Spinal stenosis L3-L4.

## 2013-08-19 ENCOUNTER — Other Ambulatory Visit (HOSPITAL_COMMUNITY): Payer: Self-pay | Admitting: Internal Medicine

## 2013-08-19 DIAGNOSIS — Z1231 Encounter for screening mammogram for malignant neoplasm of breast: Secondary | ICD-10-CM

## 2013-08-21 ENCOUNTER — Ambulatory Visit (HOSPITAL_COMMUNITY): Payer: 59

## 2014-05-24 ENCOUNTER — Ambulatory Visit (HOSPITAL_COMMUNITY): Payer: 59

## 2014-06-02 ENCOUNTER — Ambulatory Visit (HOSPITAL_COMMUNITY)
Admission: RE | Admit: 2014-06-02 | Discharge: 2014-06-02 | Disposition: A | Discharge status: Home / Self Care | Payer: Medicare Other | Source: Ambulatory Visit | Attending: Internal Medicine | Admitting: Internal Medicine

## 2014-06-02 DIAGNOSIS — Z1231 Encounter for screening mammogram for malignant neoplasm of breast: Secondary | ICD-10-CM | POA: Insufficient documentation

## 2015-06-28 LAB — BASIC METABOLIC PANEL
BUN: 16 mg/dL (ref 4–21)
Creatinine: 0.9 mg/dL (ref 0.5–1.1)
GLUCOSE: 98 mg/dL
POTASSIUM: 4.6 mmol/L (ref 3.4–5.3)
SODIUM: 140 mmol/L (ref 137–147)

## 2015-06-28 LAB — CBC AND DIFFERENTIAL
HCT: 34 % — AB (ref 36–46)
HEMOGLOBIN: 10.8 g/dL — AB (ref 12.0–16.0)
Platelets: 312 10*3/uL (ref 150–399)
WBC: 8.9 10^3/mL

## 2015-06-28 LAB — HEPATIC FUNCTION PANEL
ALT: 23 U/L (ref 7–35)
AST: 22 U/L (ref 13–35)
Alkaline Phosphatase: 71 U/L (ref 25–125)
Bilirubin, Total: 0.3 mg/dL

## 2015-11-14 ENCOUNTER — Other Ambulatory Visit (HOSPITAL_COMMUNITY): Payer: Self-pay | Admitting: Internal Medicine

## 2015-11-14 DIAGNOSIS — Z1231 Encounter for screening mammogram for malignant neoplasm of breast: Secondary | ICD-10-CM

## 2015-11-23 ENCOUNTER — Ambulatory Visit (HOSPITAL_COMMUNITY): Payer: 59

## 2015-11-25 ENCOUNTER — Encounter: Payer: Self-pay | Admitting: Rheumatology

## 2015-11-25 DIAGNOSIS — M5136 Other intervertebral disc degeneration, lumbar region: Secondary | ICD-10-CM

## 2015-11-25 DIAGNOSIS — I10 Essential (primary) hypertension: Secondary | ICD-10-CM | POA: Insufficient documentation

## 2015-11-25 DIAGNOSIS — M797 Fibromyalgia: Secondary | ICD-10-CM

## 2015-11-25 DIAGNOSIS — G4700 Insomnia, unspecified: Secondary | ICD-10-CM

## 2015-11-25 DIAGNOSIS — M51369 Other intervertebral disc degeneration, lumbar region without mention of lumbar back pain or lower extremity pain: Secondary | ICD-10-CM | POA: Insufficient documentation

## 2015-11-25 DIAGNOSIS — M5030 Other cervical disc degeneration, unspecified cervical region: Secondary | ICD-10-CM

## 2015-11-25 DIAGNOSIS — M67911 Unspecified disorder of synovium and tendon, right shoulder: Secondary | ICD-10-CM

## 2015-11-25 DIAGNOSIS — R5383 Other fatigue: Secondary | ICD-10-CM

## 2015-11-25 HISTORY — DX: Other intervertebral disc degeneration, lumbar region: M51.36

## 2015-11-25 HISTORY — DX: Fibromyalgia: M79.7

## 2015-11-25 HISTORY — DX: Unspecified disorder of synovium and tendon, right shoulder: M67.911

## 2015-11-25 HISTORY — DX: Other fatigue: R53.83

## 2015-11-25 HISTORY — DX: Other cervical disc degeneration, unspecified cervical region: M50.30

## 2015-11-25 HISTORY — DX: Insomnia, unspecified: G47.00

## 2015-11-25 HISTORY — DX: Other intervertebral disc degeneration, lumbar region without mention of lumbar back pain or lower extremity pain: M51.369

## 2015-11-25 NOTE — Progress Notes (Signed)
*IMAGE* Office Visit Note  Patient: Carrie [name]             Date of Birth: 1955/06/06           MRN: [id number]             PCP: Ignatius Speckinghruv B Vyas, MD Referring: Ignatius SpeckingVyas, Dhruv B, MD Visit Date: 11/28/2015 Occupation:@GUAROCC @    Subjective:  Follow-up Follow-up on fibromyalgia and fatigue and insomnia.  History of Present Illness: Carrie [name] is a 60 y.o. female  Patient was last seen in our office on 06/27/2015 for fibromyalgia, fatigue, insomnia. Today, she states that she is doing well with her fatigue. Although she rates it 8 on a scale of 0-10, she is getting about 6 hours of good sleep with the use of Ambien. On some nights she even gets about 8 hours of sleep. She does state that the sleep is not fully restful. He discussed the use of over-the-counter melatonin 3 mg to aid the Ambien to see if she will have improved sleep. She will give this a try.  She is exercising some but we discussed the importance of doing water aerobics. She plans to try water aerobics in the future.  She had labs done recently at Dr. Bonnita Levanvyas's office please see investigations for full details. We were worried about patient's anemia but she has recovered well by using over-the-counter iron pills.   n the last visit on 06/27/2015:===> HISTORY OF PRESENT ILLNESS:  Last seen in our office on December 27, 2014.  The patient states that she is doing about the same with her fibromyalgia as usual.  She does use Cymbalta, which does give her relief.  She has not started water aerobics, but she will open her pool some time soon and will be doing water aerobics in the pool.  Overall, she gets about 6 hours of sleep.  She uses Ambien and she states that she falls asleep about 10:00 p.m., but she normally wakes up about 4:00 a.m. and she has trouble falling back asleep.  The last time she had come to our office in December 2016, I gave her cortisone shots in the trapezius muscle area.  The patient states that she is doing well and  she does not require any further injections at this time.  She continues to have some weight loss and it continues to be a strong effort on the patient's part to lose weight, but she sees a plateauing of her weight loss.   Activities of Daily Living:  Patient reports morning stiffness for 15 minutes.   Patient Reports nocturnal pain.  Difficulty dressing/grooming: Reports Difficulty climbing stairs: Reports Difficulty getting out of chair: Reports Difficulty using hands for taps, buttons, cutlery, and/or writing: Reports   Review of Systems  Constitutional: Positive for fatigue.  HENT: Negative for mouth sores and mouth dryness.   Eyes: Negative for dryness.  Respiratory: Negative for shortness of breath.   Gastrointestinal: Negative for constipation and diarrhea.  Musculoskeletal: Positive for myalgias and myalgias.  Skin: Negative for sensitivity to sunlight.  Psychiatric/Behavioral: Positive for sleep disturbance. Negative for decreased concentration.    PMFS History:  Patient Active Problem List   Diagnosis Date Noted  . Fibromyalgia 11/25/2015  . DDD (degenerative disc disease), cervical 11/25/2015  . DDD (degenerative disc disease), lumbar 11/25/2015  . Tendinopathy of right shoulder 11/25/2015  . Insomnia 11/25/2015  . Fatigue 11/25/2015  . Hypertension 11/25/2015    Past Medical History:  Diagnosis Date  .  Anxiety   . Arthritis   . Back pain    OPLL  . DDD (degenerative disc disease), cervical 11/25/2015  . DDD (degenerative disc disease), lumbar 11/25/2015  . Depression   . Fatigue 11/25/2015  . Fibromyalgia 11/25/2015  . Hernia   . Hypertension   . Insomnia 11/25/2015  . Rotator cuff tear   . Tendinopathy of right shoulder 11/25/2015    History reviewed. No pertinent family history. Past Surgical History:  Procedure Laterality Date  . ABDOMINAL HYSTERECTOMY    . BACK SURGERY  1989   lumb lam  . GASTRIC BYPASS  2003  . HERNIA REPAIR    . NECK  SURGERY  2010   cerv fusion  . SHOULDER SURGERY  9/12   rt shoulder-DSC   Social History   Social History Narrative  . No narrative on file   On the last visit patient's social history , medication andmedicationallergies ===>  SOCIAL HISTORY:  Nonsmoker.  No alcohol.  Drinks caffeinated products and does not exercise.   CURRENT MEDICATIONS:  Gabapentin, oxycodone, diclofenac, lorazepam, triamterene, hydrochlorothiazide, Ambien, Cymbalta, Flexeril, metformin and supplements.   MEDICATION ALLERGIES:  DILAUDID and BACLOFEN.  Objective: Vital Signs: BP 136/86 (BP Location: Left Arm, Patient Position: Sitting, Cuff Size: Small)   Pulse 87   Resp 14   Ht 5\' 7"  (1.702 m)   Wt 244 lb (110.7 kg)   BMI 38.22 kg/m    Physical Exam  Constitutional: She is oriented to person, place, and time. She appears well-developed and well-nourished.  HENT:  Head: Normocephalic and atraumatic.  Eyes: EOM are normal. Pupils are equal, round, and reactive to light.  Cardiovascular: Normal rate, regular rhythm and normal heart sounds.  Exam reveals no gallop and no friction rub.   No murmur heard. Pulmonary/Chest: Effort normal and breath sounds normal. She has no wheezes. She has no rales.  Abdominal: Soft. Bowel sounds are normal. She exhibits no distension. There is no tenderness. There is no guarding. No hernia.  Musculoskeletal: Normal range of motion. She exhibits no edema, tenderness or deformity.  Lymphadenopathy:    She has no cervical adenopathy.  Neurological: She is alert and oriented to person, place, and time. Coordination normal.  Skin: Skin is warm and dry. Capillary refill takes less than 2 seconds. No rash noted.  Psychiatric: She has a normal mood and affect. Her behavior is normal.  Nursing note and vitals reviewed.    Musculoskeletal Exam:  Full range of motion of all joints except right shoulder joint is hurting somewhat the patient was not able to pronate her right hand. Grip  strength is equal and strong bilaterally Fiber myalgia tender points are 10 out of 18 positive. She has some pain to the left hip into the left medial aspect of the knee. Some of the left knee pain is because of the fiber myalgia tenderness but some of it is also related to her osteoarthritis of the left knee. I believe she has tendinosis there and she will use Voltaren gel.  She also has the right second finger with mild angulation at the DIP joint. We discussed the ring splint in the past and I'll write her prescription today so she can get that looked at and they can give her advice on that.  CDAI Exam: CDAI Homunculus Exam:   Joint Counts:  CDAI Tender Joint count: 0 CDAI Swollen Joint count: 0     Investigation:  Patient sees [facility] Sherril [facility]as [facility]  They did blood work on 09/26/2015. It shows CMP with GFR to be normal. Patient's creatinine is 0.84 which is excellent. Her GFR is 30 which is also excellent. Her liver function is AST at 24 and a LT at 28. Were very satisfied with the CMP.  She also had CBC done which is normal with a hemoglobin of 11.4 and hematocrit of 35.6. Note that at one time patient did have some anemia and she addressed this with her PCP. They recommended over-the-counter iron and she took that and she is doing much better. She is also feeling better. Patient's TSH is normal at 4.5. Her low lipid panel is also normal.   No additional findings.   Imaging: No results found.  Speciality Comments: No specialty comments available.    Procedures:  No procedures performed Allergies: Dilaudid [hydromorphone hcl]; Baclofen; and Morphine and related   Assessment / Plan: Visit Diagnoses: Fibromyalgia  Fatigue, unspecified type  Insomnia, unspecified type   I've written an order for [facility]'s [facility] to [facility] the prescription if she wantss also having pain to t second DIP joint on the last visit. I was supposed to write a  prescrip for ring splintand patient states that they did not give her . She can look at [facility] to see if they haven't there. If they do not, I can send her to [facility]  Patient recently had labs done at her PCPs office.  She is doing well with her Ambien 10mg . She will call me if she needs a refill.  She does not have a daytime muscle relaxer. So I have offered her Robaxin. She is agreeable and have given her a prescription.  she can use 500 mg twice a day when neces  Orders: No orders of the defined types were placed in this encounter.  Meds ordered this encounter  Medications  . methocarbamol (ROBAXIN) 500 MG tablet    Sig: Take 1 tablet (500 mg total) by mouth 2 (two) times daily.    Dispense:  60 tablet    Refill:  2    Order Specific Question:   Supervising Provider    Answer:   Pollyann Savoy [[id number]]  Right second DIP ring splint  Prescription for Shields brace to the left knee  Patient will get over-the-counter brace from her local drug store for left open patellar knee brace  Patient is using Flexeril at night and getting adequate sleep along with Ambien.  She does not have daytime muscle relaxer and we've discussed using Robaxin. She is agreeable. Note that baclofen cannot be used because it made her dizzy in the past. She will use Robaxin as a as needed basis. She will verify that it does not make her sleepy first and then she will use the Robaxin if it doesn't make her sleepy.  I've encouraged her to do water aerobics but she does not have access to the pool at this time. She will continue to do other exercises including walking and yoga and stretching to minimize her fibromyalgia discomfort  She is responding well to her muscle relaxers. I've asked her to use over-the-counter melatonin 3 mg at night and see how that affects her. Although she is getting more sleep now with the Ambien, I would like to see her get better sleep that is more quality so she feels refreshed  when she wakes up. A refreshing sleep plus proper exercise and proper nutrition may help her fibromyalgia discomfort even better.  Patient's fibromyalgia discomfort is  rated 10 on a scale of 0-10.  Patient's fatigue is rated 8 on a scale of 0-10  Face-to-face time spent with patient was 30 minutes. 50% of time was spent in counseling and coordination of care.  Follow-Up Instructions: Return in about 5 months (around 04/27/2016) for Fibromyalga.

## 2015-11-28 ENCOUNTER — Ambulatory Visit (INDEPENDENT_AMBULATORY_CARE_PROVIDER_SITE_OTHER): Payer: Medicare Other | Admitting: Rheumatology

## 2015-11-28 ENCOUNTER — Encounter: Payer: Self-pay | Admitting: Rheumatology

## 2015-11-28 VITALS — BP 136/86 | HR 87 | Resp 14 | Ht 67.0 in | Wt 244.0 lb

## 2015-11-28 DIAGNOSIS — M797 Fibromyalgia: Secondary | ICD-10-CM | POA: Diagnosis not present

## 2015-11-28 DIAGNOSIS — R5383 Other fatigue: Secondary | ICD-10-CM

## 2015-11-28 DIAGNOSIS — G4700 Insomnia, unspecified: Secondary | ICD-10-CM

## 2015-11-28 MED ORDER — METHOCARBAMOL 500 MG PO TABS: 500.0000 mg | ORAL_TABLET | Freq: Two times a day (BID) | ORAL | 2 refills | Status: AC

## 2016-01-31 ENCOUNTER — Other Ambulatory Visit: Payer: Self-pay | Admitting: Rheumatology

## 2016-01-31 MED ORDER — ZOLPIDEM TARTRATE 10 MG PO TABS: 10.0000 mg | ORAL_TABLET | Freq: Every evening | ORAL | 1 refills | Status: DC | PRN

## 2016-01-31 NOTE — Telephone Encounter (Signed)
Last Visit: 11/28/15 Next Visit: 04/26/16  Okay to refill Ambien?

## 2016-01-31 NOTE — Telephone Encounter (Signed)
Patient is requesting her Ambien to be sent to [facility] so her copay for it is cheaper. Please call patient and let her know if this can be done please.

## 2016-02-01 ENCOUNTER — Ambulatory Visit: Payer: 59 | Admitting: Cardiology

## 2016-02-08 ENCOUNTER — Other Ambulatory Visit (HOSPITAL_COMMUNITY): Payer: Self-pay | Admitting: Neurological Surgery

## 2016-02-08 ENCOUNTER — Telehealth: Payer: Self-pay | Admitting: Rheumatology

## 2016-02-08 DIAGNOSIS — G959 Disease of spinal cord, unspecified: Secondary | ICD-10-CM

## 2016-02-08 MED ORDER — ZOLPIDEM TARTRATE 10 MG PO TABS
10.0000 mg | ORAL_TABLET | Freq: Every evening | ORAL | 1 refills | Status: DC | PRN
Start: 2016-02-08 — End: 2016-05-05

## 2016-02-08 NOTE — Telephone Encounter (Signed)
[facility] and [facility]. Patient had one refill left on her original rx and went ahead and filled that one. Please cancel out this refill

## 2016-02-08 NOTE — Telephone Encounter (Signed)
Prescription was sent to the wrong pharmacy in error. Can we reprint the prescription and send to the correct pharmacy.

## 2016-02-08 NOTE — Addendum Note (Signed)
Addended by: Henriette CombsHATTON, Janny Crute L on: 02/08/2016 02:09 PM   Modules accepted: Orders

## 2016-02-14 ENCOUNTER — Ambulatory Visit (HOSPITAL_COMMUNITY)
Admission: RE | Admit: 2016-02-14 | Discharge: 2016-02-14 | Disposition: A | Discharge status: Home / Self Care | Payer: Medicare Other | Source: Ambulatory Visit | Attending: Neurological Surgery | Admitting: Neurological Surgery

## 2016-02-14 DIAGNOSIS — M4803 Spinal stenosis, cervicothoracic region: Secondary | ICD-10-CM | POA: Diagnosis not present

## 2016-02-14 DIAGNOSIS — G959 Disease of spinal cord, unspecified: Secondary | ICD-10-CM | POA: Diagnosis not present

## 2016-02-14 DIAGNOSIS — M4802 Spinal stenosis, cervical region: Secondary | ICD-10-CM | POA: Diagnosis not present

## 2016-02-14 DIAGNOSIS — Z9889 Other specified postprocedural states: Secondary | ICD-10-CM | POA: Diagnosis not present

## 2016-02-22 NOTE — Telephone Encounter (Signed)
Message opened in error

## 2016-02-28 ENCOUNTER — Other Ambulatory Visit (HOSPITAL_COMMUNITY): Payer: Self-pay | Admitting: Neurological Surgery

## 2016-02-28 DIAGNOSIS — M48062 Spinal stenosis, lumbar region with neurogenic claudication: Secondary | ICD-10-CM

## 2016-03-01 ENCOUNTER — Other Ambulatory Visit (HOSPITAL_COMMUNITY): Payer: Self-pay | Admitting: Internal Medicine

## 2016-03-05 ENCOUNTER — Ambulatory Visit (HOSPITAL_COMMUNITY): Payer: Medicare Other

## 2016-03-08 ENCOUNTER — Telehealth (HOSPITAL_COMMUNITY): Payer: Self-pay | Admitting: Physical Therapy

## 2016-03-08 NOTE — Telephone Encounter (Signed)
She has a lot going on right now and needs to wait on this apptment, please call her back in 8 - 10 days to reschedule.

## 2016-03-12 ENCOUNTER — Ambulatory Visit (HOSPITAL_COMMUNITY): Payer: Medicare Other | Admitting: Physical Therapy

## 2016-03-15 ENCOUNTER — Ambulatory Visit (HOSPITAL_COMMUNITY)
Admission: RE | Admit: 2016-03-15 | Discharge: 2016-03-15 | Disposition: A | Discharge status: Home / Self Care | Payer: Medicare Other | Source: Ambulatory Visit | Attending: Neurological Surgery | Admitting: Neurological Surgery

## 2016-03-15 ENCOUNTER — Ambulatory Visit (HOSPITAL_COMMUNITY)
Admission: RE | Admit: 2016-03-15 | Discharge: 2016-03-15 | Disposition: A | Discharge status: Home / Self Care | Payer: Medicare Other | Source: Ambulatory Visit | Attending: Internal Medicine | Admitting: Internal Medicine

## 2016-03-15 DIAGNOSIS — M5126 Other intervertebral disc displacement, lumbar region: Secondary | ICD-10-CM | POA: Diagnosis not present

## 2016-03-15 DIAGNOSIS — M5125 Other intervertebral disc displacement, thoracolumbar region: Secondary | ICD-10-CM | POA: Diagnosis not present

## 2016-03-15 DIAGNOSIS — M48061 Spinal stenosis, lumbar region without neurogenic claudication: Secondary | ICD-10-CM | POA: Insufficient documentation

## 2016-03-15 DIAGNOSIS — Z1231 Encounter for screening mammogram for malignant neoplasm of breast: Secondary | ICD-10-CM

## 2016-03-15 DIAGNOSIS — M48062 Spinal stenosis, lumbar region with neurogenic claudication: Secondary | ICD-10-CM | POA: Diagnosis not present

## 2016-04-06 ENCOUNTER — Other Ambulatory Visit: Payer: Self-pay | Admitting: Rheumatology

## 2016-04-06 NOTE — Telephone Encounter (Signed)
Last Visit: 11/28/15 Next Visit: 04/26/16  Okay to refill Gabapentin?

## 2016-04-24 NOTE — Progress Notes (Signed)
Office Visit Note  Patient: Carrie [name]             Date of Birth: 1955/09/06           MRN: [id number]             PCP: Ignatius Specking, MD Referring: Ignatius Specking, MD Visit Date: 04/26/2016 Occupation: @    Subjective:  Pain of the Neck; Pain of the Right Shoulder; Pain of the Left Shoulder; and Follow-up   History of Present Illness: Carrie [name] is a 61 y.o. female  Last seen November 2017. No change since last visit. Fibromyalgia continues to be an issue with some days worse than other days.  Patient currently having bilateral trapezius muscle spasm. Requesting an injection.  Does not require any med refills at this time.  Patient's husband with sclerosis. This is very stressful   Activities of Daily Living:  Patient reports morning stiffness for 30 minutes.   Patient Reports nocturnal pain.  Difficulty dressing/grooming: Denies Difficulty climbing stairs: Denies Difficulty getting out of chair: Denies Difficulty using hands for taps, buttons, cutlery, and/or writing: Reports   Review of Systems  Constitutional: Positive for fatigue.  HENT: Negative for mouth sores and mouth dryness.   Eyes: Negative for dryness.  Respiratory: Negative for shortness of breath.   Gastrointestinal: Negative for constipation and diarrhea.  Musculoskeletal: Positive for myalgias and myalgias.  Skin: Negative for sensitivity to sunlight.  Psychiatric/Behavioral: Positive for sleep disturbance. Negative for decreased concentration.    PMFS History:  Patient Active Problem List   Diagnosis Date Noted  . Fibromyalgia 11/25/2015  . DDD (degenerative disc disease), cervical 11/25/2015  . DDD (degenerative disc disease), lumbar 11/25/2015  . Tendinopathy of right shoulder 11/25/2015  . Insomnia 11/25/2015  . Fatigue 11/25/2015  . Hypertension 11/25/2015    Past Medical History:  Diagnosis Date  . Anxiety   . Arthritis   . Back pain    OPLL  . DDD (degenerative  disc disease), cervical 11/25/2015  . DDD (degenerative disc disease), lumbar 11/25/2015  . Depression   . Fatigue 11/25/2015  . Fibromyalgia 11/25/2015  . Hernia   . Hypertension   . Insomnia 11/25/2015  . Rotator cuff tear   . Tendinopathy of right shoulder 11/25/2015    No family history on file. Past Surgical History:  Procedure Laterality Date  . ABDOMINAL HYSTERECTOMY    . BACK SURGERY  1989   lumb lam  . GASTRIC BYPASS  2003  . HERNIA REPAIR    . NECK SURGERY  2010   cerv fusion  . SHOULDER SURGERY  9/12   rt shoulder-DSC   Social History   Social History Narrative  . No narrative on file     Objective: Vital Signs: BP 118/60   Pulse 78   Ht  (1.702 m)   Wt 240 lb (108.9 kg)   BMI 37.59 kg/m    Physical Exam  Constitutional: She is oriented to person, place, and time. She appears well-developed and well-nourished.  HENT:  Head: Normocephalic and atraumatic.  Eyes: EOM are normal. Pupils are equal, round, and reactive to light.  Cardiovascular: Normal rate, regular rhythm and normal heart sounds.  Exam reveals no gallop and no friction rub.   No murmur heard. Pulmonary/Chest: Effort normal and breath sounds normal. She has no wheezes. She has no rales.  Abdominal: Soft. Bowel sounds are normal. She exhibits no distension. There is no tenderness. There is no  guarding. No hernia.  Musculoskeletal: Normal range of motion. She exhibits no edema, tenderness or deformity.  Lymphadenopathy:    She has no cervical adenopathy.  Neurological: She is alert and oriented to person, place, and time. Coordination normal.  Skin: Skin is warm and dry. Capillary refill takes less than 2 seconds. No rash noted.  Psychiatric: She has a normal mood and affect. Her behavior is normal.  Nursing note and vitals reviewed.    Musculoskeletal Exam:  Full range of motion of all joints Grip strength is equal and strong bilaterally Fiber myalgia tender points are 18 out of 18  positive  CDAI Exam: CDAI Homunculus Exam:   Joint Counts:  CDAI Tender Joint count: 0 CDAI Swollen Joint count: 0  Global Assessments:  Patient Global Assessment: 8 Provider Global Assessment: 8  CDAI Calculated Score: 16    Investigation: Findings:  Patient sees Dr. Sherril Croon Dan Maker Hairfield at [facility]. They did blood work on 09/26/2015. It shows CMP with GFR to be normal. Patient's creatinine is 0.84 which is excellent. Her GFR is 30 which is also excellent. Her liver function is AST at 24 and a LT at 28. Were very satisfied with the CMP.  She also had CBC done which is normal with a hemoglobin of 11.4 and hematocrit of 35.6. Note that at one time patient did have some anemia and she addressed this with her PCP. They recommended over-the-counter iron and she took that and she is doing much better. She is also feeling better. Patient's TSH is normal at 4.5. Her low lipid panel is also normal.  Abstract on 11/25/2015  Component Date Value Ref Range Status  . Hemoglobin 06/28/2015 10.8* 12.0 - 16.0 g/dL Final  . HCT 40/98/1191 34* 36 - 46 % Final  . Platelets 06/28/2015 312  150 - 399 K/L Final  . WBC 06/28/2015 8.9  10^3/mL Final  . Glucose 06/28/2015 98  mg/dL Final  . BUN 47/82/9562 16  4 - 21 mg/dL Final  . Creatinine 13/08/6576 0.9  0.5 - 1.1 mg/dL Final  . Potassium 46/96/2952 4.6  3.4 - 5.3 mmol/L Final  . Sodium 06/28/2015 140  137 - 147 mmol/L Final  . Alkaline Phosphatase 06/28/2015 71  25 - 125 U/L Final  . ALT 06/28/2015 23  7 - 35 U/L Final  . AST 06/28/2015 22  13 - 35 U/L Final  . Bilirubin, Total 06/28/2015 0.3  mg/dL Final      Imaging: No results found.  Speciality Comments: No specialty comments available.    Procedures:  Trigger Point Inj Date/Time: 04/26/2016 1:34 PM Performed by: Tawni Pummel Authorized by: Tawni Pummel   Consent Given by:  Patient Site marked: the procedure site was marked   Timeout: prior to  procedure the correct patient, procedure, and site was verified   Indications:  Muscle spasm and pain Total # of Trigger Points:  2 Location: neck   Needle Size:  27 G Approach:  Dorsal Medications #1:  0.3 mL lidocaine 1 %; 10 mg triamcinolone acetonide 40 MG/ML Medications #2:  10 mg triamcinolone acetonide 40 MG/ML; 0.3 mL lidocaine 1 % Patient tolerance:  Patient tolerated the procedure well with no immediate complications Comments: Bilateral trapezius muscles were injected today. Patient tolerated procedure well. There are no competitions   Allergies: Dilaudid [hydromorphone hcl]; Baclofen; Lisinopril; and Morphine and related   Assessment / Plan:     Visit Diagnoses: Fibromyalgia - 04/26/2016: Fibromyalgia discomfort 8 on a scale of 0-10  Other fatigue - Plan: CBC with Differential/Platelet, COMPLETE METABOLIC PANEL WITH GFR, VITAMIN D 25 Hydroxy (Vit-D Deficiency, Fractures)  Other insomnia  Trapezius muscle spasm - 04/26/2016. Bilateral trapezius muscles were injected today. Pain was 8 on a scale of 0-10  DDD (degenerative disc disease), lumbar - 04/26/2016: Patient is seeing Dr. Danielle Dess and had MRI done March 2018; offered Txpatient considering. Advised pt to discuss option of physical therapy  DDD (degenerative disc disease), cervical    Orders: Orders Placed This Encounter  Procedures  . Trigger Point Injection  . CBC with Differential/Platelet  . COMPLETE METABOLIC PANEL WITH GFR  . VITAMIN D 25 Hydroxy (Vit-D Deficiency, Fractures)   No orders of the defined types were placed in this encounter.   Face-to-face time spent with patient was 30 minutes. 50% of time was spent in counseling and coordination of care.  Follow-Up Instructions: Return in about 6 months (around 10/26/2016) for FMS, FATIGUE,INSOMNIA.   Tawni Pummel, PA-C  Note - This record has been created using [employer].  Chart creation errors have been sought, but may not always  have been  located. Such creation errors do not reflect on  the standard of medical care.

## 2016-04-26 ENCOUNTER — Ambulatory Visit (INDEPENDENT_AMBULATORY_CARE_PROVIDER_SITE_OTHER): Payer: Medicare Other | Admitting: Rheumatology

## 2016-04-26 ENCOUNTER — Encounter: Payer: Self-pay | Admitting: Rheumatology

## 2016-04-26 VITALS — BP 118/60 | HR 78 | Ht 67.0 in | Wt 240.0 lb

## 2016-04-26 DIAGNOSIS — R5383 Other fatigue: Secondary | ICD-10-CM

## 2016-04-26 DIAGNOSIS — G4709 Other insomnia: Secondary | ICD-10-CM

## 2016-04-26 DIAGNOSIS — M5136 Other intervertebral disc degeneration, lumbar region: Secondary | ICD-10-CM

## 2016-04-26 DIAGNOSIS — M5030 Other cervical disc degeneration, unspecified cervical region: Secondary | ICD-10-CM | POA: Diagnosis not present

## 2016-04-26 DIAGNOSIS — M62838 Other muscle spasm: Secondary | ICD-10-CM

## 2016-04-26 DIAGNOSIS — M797 Fibromyalgia: Secondary | ICD-10-CM

## 2016-04-26 DIAGNOSIS — M51369 Other intervertebral disc degeneration, lumbar region without mention of lumbar back pain or lower extremity pain: Secondary | ICD-10-CM

## 2016-04-26 LAB — CBC WITH DIFFERENTIAL/PLATELET
Basophils Absolute: 87 cells/uL (ref 0–200)
Basophils Relative: 1 %
EOS PCT: 5 %
Eosinophils Absolute: 435 cells/uL (ref 15–500)
HCT: 39.5 % (ref 35.0–45.0)
HEMOGLOBIN: 12.8 g/dL (ref 11.7–15.5)
LYMPHS ABS: 2349 {cells}/uL (ref 850–3900)
LYMPHS PCT: 27 %
MCH: 27.5 pg (ref 27.0–33.0)
MCHC: 32.4 g/dL (ref 32.0–36.0)
MCV: 84.9 fL (ref 80.0–100.0)
MONO ABS: 696 {cells}/uL (ref 200–950)
MPV: 9.7 fL (ref 7.5–12.5)
Monocytes Relative: 8 %
NEUTROS ABS: 5133 {cells}/uL (ref 1500–7800)
Neutrophils Relative %: 59 %
Platelets: 303 10*3/uL (ref 140–400)
RBC: 4.65 MIL/uL (ref 3.80–5.10)
RDW: 13.8 % (ref 11.0–15.0)
WBC: 8.7 10*3/uL (ref 3.8–10.8)

## 2016-04-26 MED ORDER — LIDOCAINE HCL 1 % IJ SOLN
0.3000 mL | INTRAMUSCULAR | Status: AC | PRN
  Administered 2016-04-26: .3 mL

## 2016-04-26 MED ORDER — TRIAMCINOLONE ACETONIDE 40 MG/ML IJ SUSP
10.0000 mg | INTRAMUSCULAR | Status: AC | PRN
  Administered 2016-04-26: 10 mg via INTRAMUSCULAR

## 2016-04-27 ENCOUNTER — Other Ambulatory Visit (INDEPENDENT_AMBULATORY_CARE_PROVIDER_SITE_OTHER): Payer: Self-pay | Admitting: *Deleted

## 2016-04-27 LAB — COMPLETE METABOLIC PANEL WITH GFR
ALBUMIN: 4.2 g/dL (ref 3.6–5.1)
ALK PHOS: 67 U/L (ref 33–130)
ALT: 16 U/L (ref 6–29)
AST: 17 U/L (ref 10–35)
BUN: 15 mg/dL (ref 7–25)
CALCIUM: 9.8 mg/dL (ref 8.6–10.4)
CHLORIDE: 97 mmol/L — AB (ref 98–110)
CO2: 28 mmol/L (ref 20–31)
Creat: 0.98 mg/dL (ref 0.50–1.05)
GFR, EST NON AFRICAN AMERICAN: 63 mL/min (ref >=60)
GFR, Est African American: 73 mL/min (ref >=60)
GLUCOSE: 88 mg/dL (ref 65–99)
POTASSIUM: 4.8 mmol/L (ref 3.5–5.3)
SODIUM: 137 mmol/L (ref 135–146)
Total Bilirubin: 0.3 mg/dL (ref 0.2–1.2)
Total Protein: 6.7 g/dL (ref 6.1–8.1)

## 2016-04-27 LAB — VITAMIN D 25 HYDROXY (VIT D DEFICIENCY, FRACTURES): VIT D 25 HYDROXY: 39 ng/mL (ref 30–100)

## 2016-04-27 MED ORDER — METHOCARBAMOL 500 MG PO TABS: ORAL_TABLET | ORAL | 1 refills | Status: DC

## 2016-04-27 NOTE — Telephone Encounter (Signed)
Last Visit: 04/26/16 Next Visit: 10/25/16  Okay to refill Robaxin?

## 2016-04-27 NOTE — Telephone Encounter (Signed)
Patient called in this morning in regards to needing a prescription refill on her Robaxin . She was supposed to get it filled a couple months ago but she never did and now the prescription is out dated and not able to be filled. She would like it to be sent into [facility] in [place] please. Her CB # [phone]. Thank you

## 2016-04-30 ENCOUNTER — Telehealth: Payer: Self-pay | Admitting: Radiology

## 2016-04-30 NOTE — Telephone Encounter (Signed)
Patient has left voice mail she has questions about labs and RX  Wants call back [phone]

## 2016-04-30 NOTE — Telephone Encounter (Signed)
Patient asking what her Vitamin D level and if she could continue taking her OTC Vitamin D. Patient advised her Vitamin D level and is 39 and she could continue her OTC vitamin D. Patient also wanted to make sure her Robaxin had been sent to the pharmacy. Patient advised it was sent in on 04/27/16.

## 2016-05-05 ENCOUNTER — Other Ambulatory Visit: Payer: Self-pay | Admitting: Rheumatology

## 2016-05-07 ENCOUNTER — Other Ambulatory Visit: Payer: Self-pay | Admitting: Rheumatology

## 2016-05-07 NOTE — Telephone Encounter (Signed)
04/26/16 last visit  10/25/16 next visit  Ok to refill Ambien ?

## 2016-10-17 ENCOUNTER — Other Ambulatory Visit: Payer: Self-pay | Admitting: Rheumatology

## 2016-10-18 NOTE — Telephone Encounter (Signed)
04/26/16 last visit  10/25/16 next visit   Okay to refill per Dr. Corliss Skains

## 2016-10-23 ENCOUNTER — Telehealth: Payer: Self-pay | Admitting: Rheumatology

## 2016-10-23 ENCOUNTER — Other Ambulatory Visit: Payer: Self-pay | Admitting: *Deleted

## 2016-10-23 MED ORDER — ZOLPIDEM TARTRATE 10 MG PO TABS: 10.0000 mg | ORAL_TABLET | Freq: Every day | ORAL | 1 refills | Status: DC

## 2016-10-23 NOTE — Telephone Encounter (Signed)
Left message to advise patient prescription has been faxed to the pharmacy.  

## 2016-10-23 NOTE — Telephone Encounter (Signed)
Refill request receive via fax  04/26/16 last visit  10/25/16 next visit   Okay to refill Ambien?

## 2016-10-23 NOTE — Telephone Encounter (Signed)
ok 

## 2016-10-23 NOTE — Telephone Encounter (Signed)
Patient needs refill on Ambien sent to [facility]. Please send in today if possible, patient running out.

## 2016-10-25 ENCOUNTER — Ambulatory Visit: Payer: Medicare Other | Admitting: Rheumatology

## 2016-10-25 ENCOUNTER — Telehealth: Payer: Self-pay | Admitting: Rheumatology

## 2016-10-25 NOTE — Telephone Encounter (Signed)
Patient states [facility] still does not have rx for Ambien. Please call in for patient. She is almost out, and has been trying to get this since Thursday. [phone] Account# [id number]

## 2016-10-26 MED ORDER — ZOLPIDEM TARTRATE 10 MG PO TABS: 10.0000 mg | ORAL_TABLET | Freq: Every day | ORAL | 1 refills | Status: DC

## 2016-10-26 NOTE — Telephone Encounter (Signed)
Prescription had to be reprinted due to the pharmacy not receiving original fax. Resent prescription.

## 2016-10-26 NOTE — Telephone Encounter (Signed)
Patient called stating that  [facility] has not received fax for Ambien.  Patient gave a fax # [phone] and would also like a phone call when it has been re faxed.  Thank You.

## 2016-10-29 ENCOUNTER — Telehealth: Payer: Self-pay | Admitting: Rheumatology

## 2016-10-29 NOTE — Telephone Encounter (Signed)
Prescription called into [facility] pharmacy in [place]. It was not received by [facility]. Prescription was approved 10/26/16 by Dr. Corliss Skains.

## 2016-10-29 NOTE — Telephone Encounter (Signed)
Patient calling again in reference to Ambien rx. Rx has not been received at [facility]. Patient is now completely out of meds. Please call patient to discuss next set so she can get meds.

## 2016-10-30 ENCOUNTER — Telehealth: Payer: Self-pay

## 2016-10-30 NOTE — Telephone Encounter (Signed)
Please handle

## 2016-10-30 NOTE — Telephone Encounter (Signed)
Patient cannot have an Ambien with lorazepam and Percocet. Discontinue the prescription of Ambien.

## 2016-10-30 NOTE — Telephone Encounter (Signed)
Diane with [facility] pharmacy would like to know if it is okay for patient to continue Ambien being that patient is taking Lorazepam and Percocet.  CB# is [phone].  Please advise.  Thank You.

## 2016-10-31 ENCOUNTER — Telehealth: Payer: Self-pay

## 2016-10-31 NOTE — Telephone Encounter (Signed)
Patient called concerning Rx for Ambien, advised patient of message per Dr. Corliss Skainseveshwar.  Would like to know if another Rx could be sent to pharmacy?  Cb# [phone].  Please Advise. Thank You.

## 2016-10-31 NOTE — Telephone Encounter (Signed)
Patient advised we are unable to prescribe a sedative medication for her because she is on the lorazepam. Patient advised to contact the doctor who is prescribing it for her. Patient verbalized understanding.

## 2016-10-31 NOTE — Telephone Encounter (Signed)
See previous phone note.  

## 2016-10-31 NOTE — Telephone Encounter (Signed)
Attempted to contact the patient and left message for patient to contact the office.  

## 2016-10-31 NOTE — Telephone Encounter (Signed)
Patient was returning your call.  Cb# is [phone].  Please advise.

## 2016-10-31 NOTE — Telephone Encounter (Signed)
She cannot take another sedative medication with lorazepam. She may contact her physician who is prescribing lorazepam.

## 2016-10-31 NOTE — Telephone Encounter (Signed)
Advised pharmacy to cancel prescription for Ambien.

## 2016-11-13 ENCOUNTER — Ambulatory Visit (INDEPENDENT_AMBULATORY_CARE_PROVIDER_SITE_OTHER): Payer: Medicare Other | Admitting: Rheumatology

## 2016-11-13 ENCOUNTER — Encounter: Payer: Self-pay | Admitting: Rheumatology

## 2016-11-13 VITALS — BP 135/81 | HR 80 | Resp 13 | Wt 243.0 lb

## 2016-11-13 DIAGNOSIS — G4709 Other insomnia: Secondary | ICD-10-CM | POA: Diagnosis not present

## 2016-11-13 DIAGNOSIS — R5383 Other fatigue: Secondary | ICD-10-CM | POA: Diagnosis not present

## 2016-11-13 DIAGNOSIS — M797 Fibromyalgia: Secondary | ICD-10-CM | POA: Diagnosis not present

## 2016-11-13 DIAGNOSIS — M62838 Other muscle spasm: Secondary | ICD-10-CM | POA: Diagnosis not present

## 2016-11-13 DIAGNOSIS — M5136 Other intervertebral disc degeneration, lumbar region: Secondary | ICD-10-CM | POA: Diagnosis not present

## 2016-11-13 LAB — COMPLETE METABOLIC PANEL WITH GFR
AG Ratio: 1.9 (calc) (ref 1.0–2.5)
ALBUMIN MSPROF: 4.4 g/dL (ref 3.6–5.1)
ALKALINE PHOSPHATASE (APISO): 68 U/L (ref 33–130)
ALT: 16 U/L (ref 6–29)
AST: 17 U/L (ref 10–35)
BUN: 14 mg/dL (ref 7–25)
CALCIUM: 10.4 mg/dL (ref 8.6–10.4)
CO2: 28 mmol/L (ref 20–32)
Chloride: 96 mmol/L — ABNORMAL LOW (ref 98–110)
Creat: 0.84 mg/dL (ref 0.50–0.99)
GFR, EST NON AFRICAN AMERICAN: 76 mL/min/{1.73_m2} (ref >=60)
GFR, Est African American: 88 mL/min/{1.73_m2} (ref >=60)
GLOBULIN: 2.3 g/dL (ref 1.9–3.7)
GLUCOSE: 102 mg/dL — AB (ref 65–99)
Potassium: 4.9 mmol/L (ref 3.5–5.3)
Sodium: 136 mmol/L (ref 135–146)
Total Bilirubin: 0.4 mg/dL (ref 0.2–1.2)
Total Protein: 6.7 g/dL (ref 6.1–8.1)

## 2016-11-13 LAB — CBC WITH DIFFERENTIAL/PLATELET
BASOS ABS: 92 {cells}/uL (ref 0–200)
Basophils Relative: 1.1 %
EOS PCT: 5 %
Eosinophils Absolute: 420 cells/uL (ref 15–500)
HEMATOCRIT: 36.5 % (ref 35.0–45.0)
HEMOGLOBIN: 12.2 g/dL (ref 11.7–15.5)
Lymphs Abs: 1730 cells/uL (ref 850–3900)
MCH: 27.7 pg (ref 27.0–33.0)
MCHC: 33.4 g/dL (ref 32.0–36.0)
MCV: 82.8 fL (ref 80.0–100.0)
MONOS PCT: 8 %
MPV: 10.1 fL (ref 7.5–12.5)
NEUTROS ABS: 5485 {cells}/uL (ref 1500–7800)
Neutrophils Relative %: 65.3 %
Platelets: 301 10*3/uL (ref 140–400)
RBC: 4.41 10*6/uL (ref 3.80–5.10)
RDW: 13.2 % (ref 11.0–15.0)
Total Lymphocyte: 20.6 %
WBC mixed population: 672 cells/uL (ref 200–950)
WBC: 8.4 10*3/uL (ref 3.8–10.8)

## 2016-11-13 MED ORDER — TIZANIDINE HCL 4 MG PO TABS: 4.0000 mg | ORAL_TABLET | Freq: Four times a day (QID) | ORAL | 2 refills | Status: DC | PRN

## 2016-11-13 NOTE — Patient Instructions (Signed)
Stop Flexeril  Use Zanaflex in its place; 1 pill (4 mg) every night as needed sleep and muscle relaxation.

## 2016-11-13 NOTE — Progress Notes (Signed)
Office Visit Note  Patient: Carrie [name]             Date of Birth: May 04, 1955           MRN: [id number]             PCP: Glenda Chroman, MD Referring: Glenda Chroman, MD Visit Date: 11/13/2016 Occupation: '@GUAROCC' @    Subjective:  No chief complaint on file.   History of Present Illness: Carrie [name] is a 61 y.o. female  Fms is rated 8 on a scale of 0-10 most days.  Patient uses Ambien for sleep. Unfortunately, our office is unable to provide Ambien but we have asked the patient to get it from her PCP Patient is agreeable.  Patient would like to see if there is any other options available for her.  I offered her Zanaflex in the place of Flexeril and patient is agreeable since Flexeril does not make the patient sleepy but it gives her good muscle relaxation. Patient will stop the Flexeril and in its place use the tizanidine.  If tizanidine works well, she will not need the Ambien after all.    Activities of Daily Living:  Patient reports morning stiffness for 15 minutes.   Patient Reports nocturnal pain.  Difficulty dressing/grooming: Reports Difficulty climbing stairs: Reports Difficulty getting out of chair: Reports Difficulty using hands for taps, buttons, cutlery, and/or writing: Reports   Review of Systems  Constitutional: Positive for fatigue.  HENT: Negative for mouth sores and mouth dryness.   Eyes: Negative for dryness.  Respiratory: Negative for shortness of breath.   Gastrointestinal: Negative for constipation and diarrhea.  Musculoskeletal: Positive for myalgias and myalgias.  Skin: Negative for sensitivity to sunlight.  Psychiatric/Behavioral: Positive for sleep disturbance. Negative for decreased concentration.    PMFS History:  Patient Active Problem List   Diagnosis Date Noted  . Fibromyalgia 11/25/2015  . DDD (degenerative disc disease), cervical 11/25/2015  . DDD (degenerative disc disease), lumbar 11/25/2015  . Tendinopathy of right shoulder  11/25/2015  . Insomnia 11/25/2015  . Fatigue 11/25/2015  . Hypertension 11/25/2015    Past Medical History:  Diagnosis Date  . Anxiety   . Arthritis   . Back pain    OPLL  . DDD (degenerative disc disease), cervical 11/25/2015  . DDD (degenerative disc disease), lumbar 11/25/2015  . Depression   . Fatigue 11/25/2015  . Fibromyalgia 11/25/2015  . Hernia   . Hypertension   . Insomnia 11/25/2015  . Rotator cuff tear   . Tendinopathy of right shoulder 11/25/2015    No family history on file. Past Surgical History:  Procedure Laterality Date  . ABDOMINAL HYSTERECTOMY    . BACK SURGERY  1989   lumb lam  . GASTRIC BYPASS  2003  . HERNIA REPAIR    . NECK SURGERY  2010   cerv fusion  . SHOULDER SURGERY  9/12   rt shoulder-DSC   Social History   Social History Narrative  . No narrative on file     Objective: Vital Signs: There were no vitals taken for this visit.   Physical Exam  Constitutional: She is oriented to person, place, and time. She appears well-developed and well-nourished.  HENT:  Head: Normocephalic and atraumatic.  Eyes: Pupils are equal, round, and reactive to light. EOM are normal.  Cardiovascular: Normal rate, regular rhythm and normal heart sounds.  Exam reveals no gallop and no friction rub.   No murmur heard. Pulmonary/Chest: Effort normal and  breath sounds normal. She has no wheezes. She has no rales.  Abdominal: Soft. Bowel sounds are normal. She exhibits no distension. There is no tenderness. There is no guarding. No hernia.  Musculoskeletal: Normal range of motion. She exhibits no edema, tenderness or deformity.  Lymphadenopathy:    She has no cervical adenopathy.  Neurological: She is alert and oriented to person, place, and time. Coordination normal.  Skin: Skin is warm and dry. Capillary refill takes less than 2 seconds. No rash noted.  Psychiatric: She has a normal mood and affect. Her behavior is normal.  Nursing note and vitals  reviewed.    Musculoskeletal Exam:  Full range of motion of all joints Grip strength is equal and strong bilaterally Fibromyalgia tender points 18 out of 18 positive  CDAI Exam: No CDAI exam completed.  No synovitis on examination  Investigation: No additional findings. No visits with results within 6 Month(s) from this visit.  Latest known visit with results is:  Office Visit on 04/26/2016  Component Date Value Ref Range Status  . WBC 04/26/2016 8.7  3.8 - 10.8 K/uL Final  . RBC 04/26/2016 4.65  3.80 - 5.10 MIL/uL Final  . Hemoglobin 04/26/2016 12.8  11.7 - 15.5 g/dL Final  . HCT 04/26/2016 39.5  35.0 - 45.0 % Final  . MCV 04/26/2016 84.9  80.0 - 100.0 fL Final  . MCH 04/26/2016 27.5  27.0 - 33.0 pg Final  . MCHC 04/26/2016 32.4  32.0 - 36.0 g/dL Final  . RDW 04/26/2016 13.8  11.0 - 15.0 % Final  . Platelets 04/26/2016 303  140 - 400 K/uL Final  . MPV 04/26/2016 9.7  7.5 - 12.5 fL Final  . Neutro Abs 04/26/2016 5133  1,500 - 7,800 cells/uL Final  . Lymphs Abs 04/26/2016 2349  850 - 3,900 cells/uL Final  . Monocytes Absolute 04/26/2016 696  200 - 950 cells/uL Final  . Eosinophils Absolute 04/26/2016 435  15 - 500 cells/uL Final  . Basophils Absolute 04/26/2016 87  0 - 200 cells/uL Final  . Neutrophils Relative % 04/26/2016 59  % Final  . Lymphocytes Relative 04/26/2016 27  % Final  . Monocytes Relative 04/26/2016 8  % Final  . Eosinophils Relative 04/26/2016 5  % Final  . Basophils Relative 04/26/2016 1  % Final  . Smear Review 04/26/2016 Criteria for review not met   Final  . Sodium 04/26/2016 137  135 - 146 mmol/L Final  . Potassium 04/26/2016 4.8  3.5 - 5.3 mmol/L Final  . Chloride 04/26/2016 97* 98 - 110 mmol/L Final  . CO2 04/26/2016 28  20 - 31 mmol/L Final  . Glucose, Bld 04/26/2016 88  65 - 99 mg/dL Final  . BUN 04/26/2016 15  7 - 25 mg/dL Final  . Creat 04/26/2016 0.98  0.50 - 1.05 mg/dL Final   Comment:   For patients > or = 60 years of age: The upper  reference limit for Creatinine is approximately 13% higher for people identified as African-American.     . Total Bilirubin 04/26/2016 0.3  0.2 - 1.2 mg/dL Final  . Alkaline Phosphatase 04/26/2016 67  33 - 130 U/L Final  . AST 04/26/2016 17  10 - 35 U/L Final  . ALT 04/26/2016 16  6 - 29 U/L Final  . Total Protein 04/26/2016 6.7  6.1 - 8.1 g/dL Final  . Albumin 04/26/2016 4.2  3.6 - 5.1 g/dL Final  . Calcium 04/26/2016 9.8  8.6 - 10.4 mg/dL Final  .  GFR, Est African American 04/26/2016 73  >=60 mL/min Final  . GFR, Est Non African American 04/26/2016 63  >=60 mL/min Final  . Vit D, 25-Hydroxy 04/26/2016 39  30 - 100 ng/mL Final   Comment: Vitamin D Status           25-OH Vitamin D        Deficiency                <20 ng/mL        Insufficiency         20 - 29 ng/mL        Optimal             > or = 30 ng/mL   For 25-OH Vitamin D testing on patients on D2-supplementation and patients for whom quantitation of D2 and D3 fractions is required, the [facility]ureD 25-OH VIT D, (D2,D3), LC/MS/MS is recommended: order code [id number] (patients > 2 yrs).      Imaging: No results found.  Speciality Comments: No specialty comments available.    Procedures:  No procedures performed Allergies: Dilaudid [hydromorphone hcl]; Baclofen; Lisinopril; and Morphine and related   Assessment / Plan:     Visit Diagnoses: No diagnosis found.   #1: Fibromyalgia syndrome. Patient rates her discomfort as 8 on a scale of 0-10.  2.:  Fatigue and insomnia.  Ongoing. Patient would like to have a refill of Ambien but our office is unable to give for that refill. Her PCP also declines giving Ambien. Patient states that the Flexeril that she has been using is not helping her sleep. We discussed using Zanaflex to see if it will give her muscle relaxation as well as help her sleep.  If it works well, she will use Ambien or needed Ambien. I written her a new prescription for Zanaflex 4 mg 1 p.o. nightly; 30-day  supply with 2 refills.  3.:  Continue Robaxin 500 mg twice daily as needed  4.:  Return to clinic in 6 months  Orders: No orders of the defined types were placed in this encounter.  No orders of the defined types were placed in this encounter.   Face-to-face time spent with patient was 30 minutes. 50% of time was spent in counseling and coordination of care.  Follow-Up Instructions: No Follow-up on file.   Eliezer Lofts, PA-C  Note - This record has been created using [employer].  Chart creation errors have been sought, but may not always  have been located. Such creation errors do not reflect on  the standard of medical care.

## 2016-11-14 NOTE — Progress Notes (Signed)
Within normal limits

## 2016-11-21 ENCOUNTER — Ambulatory Visit: Payer: Medicare Other | Admitting: Rheumatology

## 2017-01-18 ENCOUNTER — Other Ambulatory Visit: Payer: Self-pay | Admitting: Rheumatology

## 2017-01-21 NOTE — Telephone Encounter (Signed)
Last Visit: 11/13/16 Next visit: 05/16/17  Okay to refill per Dr. Corliss Skainseveshwar

## 2017-01-25 ENCOUNTER — Other Ambulatory Visit: Payer: Self-pay | Admitting: *Deleted

## 2017-01-25 MED ORDER — GABAPENTIN 300 MG PO CAPS: ORAL_CAPSULE | ORAL | 0 refills | Status: DC

## 2017-01-25 NOTE — Telephone Encounter (Signed)
Refill request received via fax  Last Visit: 11/13/16 Next visit: 05/16/17  Okay to refill per Dr. Corliss Skainseveshwar

## 2017-04-16 ENCOUNTER — Other Ambulatory Visit: Payer: Self-pay | Admitting: *Deleted

## 2017-04-16 MED ORDER — GABAPENTIN 300 MG PO CAPS: ORAL_CAPSULE | ORAL | 0 refills | Status: DC

## 2017-04-16 NOTE — Telephone Encounter (Signed)
Refill request received via fax  Last Visit: 11/13/16 Next visit: 05/16/17  Okay to refill per Dr. Corliss Skainseveshwar

## 2017-05-02 NOTE — Progress Notes (Signed)
Office Visit Note  Patient: Carrie [name]             Date of Birth: 1955/05/21           MRN: [id number]             PCP: Ignatius SpeckingVyas, Dhruv B, MD Referring: Ignatius SpeckingVyas, Dhruv B, MD Visit Date: 05/16/2017 Occupation: @GUAROCC @    Subjective:  Generalized pain   History of Present Illness: Carrie Beachnna L Rinker is a 62 y.o. female with history of fibromyalgia and DDD.  Patient takes Flexeril 10 mg PRN, Robaxin 500 mg BID, gabapentin 300 mg 2 tablets 3 times daily.  She was taken off of Ambien and switch to Restoril.  She states she continues to have very interrupted sleep.  She states that she is previously taking Zanaflex but did not find it effective and is discontinued.  She continues to have chronic generalized muscle tenderness and muscle aches.  She states that she has had more stress at home which has been causing her fibromyalgia to flare.  She states that she continues to have fatigue especially first thing in the morning especially when she is having more joint stiffness.  She states that she is having pain in bilateral CMC joints.  She states that occasionally she will notice some swelling in this region.  She denies any pain in her knees at this time.  She continues to have chronic pain in her neck and lower back and is followed along by Dr. Danielle DessElsner.  She states that her right shoulder continues to cause discomfort and she has limited range of motion.  She states that she had a right rotator cuff repair about 4 to 5 years ago and still experiences pain when lying on her right side.    Activities of Daily Living:  Patient reports morning stiffness for 1  hour.   Patient Reports nocturnal pain.  Difficulty dressing/grooming: Denies Difficulty climbing stairs: Denies Difficulty getting out of chair: Denies Difficulty using hands for taps, buttons, cutlery, and/or writing: Denies   Review of Systems  Constitutional: Negative for fatigue.  HENT: Negative for mouth sores, mouth dryness and nose  dryness.   Eyes: Negative for pain, visual disturbance and dryness.  Respiratory: Negative for cough, hemoptysis, shortness of breath and difficulty breathing.   Cardiovascular: Negative for chest pain, palpitations, hypertension and swelling in legs/feet.  Gastrointestinal: Negative for blood in stool, constipation and diarrhea.  Endocrine: Negative for increased urination.  Genitourinary: Negative for painful urination.  Musculoskeletal: Negative for arthralgias, joint pain, joint swelling, myalgias, muscle weakness, morning stiffness, muscle tenderness and myalgias.  Skin: Negative for color change, pallor, rash, hair loss, nodules/bumps, skin tightness, ulcers and sensitivity to sunlight.  Allergic/Immunologic: Negative for susceptible to infections.  Neurological: Negative for dizziness, numbness, headaches and weakness.  Hematological: Negative for swollen glands.  Psychiatric/Behavioral: Negative for depressed mood and sleep disturbance. The patient is not nervous/anxious.     PMFS History:  Patient Active Problem List   Diagnosis Date Noted  . Fibromyalgia 11/25/2015  . DDD (degenerative disc disease), cervical 11/25/2015  . DDD (degenerative disc disease), lumbar 11/25/2015  . Tendinopathy of right shoulder 11/25/2015  . Insomnia 11/25/2015  . Fatigue 11/25/2015  . Hypertension 11/25/2015    Past Medical History:  Diagnosis Date  . Anxiety   . Arthritis   . Back pain    OPLL  . DDD (degenerative disc disease), cervical 11/25/2015  . DDD (degenerative disc disease), lumbar 11/25/2015  . Depression   .  Fatigue 11/25/2015  . Fibromyalgia 11/25/2015  . Hernia   . Hypertension   . Insomnia 11/25/2015  . Rotator cuff tear   . Tendinopathy of right shoulder 11/25/2015    Family History  Problem Relation Age of Onset  . Cancer Mother        breast   . Stroke Mother   . Diabetes Father   . Osteoarthritis Father   . Diabetes Sister   . Stroke Sister   .  Osteoarthritis Sister   . Healthy Daughter   . Healthy Son    Past Surgical History:  Procedure Laterality Date  . ABDOMINAL HYSTERECTOMY    . BACK SURGERY  1989   lumb lam  . GASTRIC BYPASS  2003  . HERNIA REPAIR    . NECK SURGERY  2010   cerv fusion  . SHOULDER SURGERY  9/12   rt shoulder-DSC   Social History   Social History Narrative  . Not on file     Objective: Vital Signs: BP 129/76 (BP Location: Left Arm, Patient Position: Sitting, Cuff Size: Normal)   Pulse 75   Resp 17   Ht 5\' 7"  (1.702 m)   Wt 235 lb (106.6 kg)   BMI 36.81 kg/m    Physical Exam  Constitutional: She is oriented to person, place, and time. She appears well-developed and well-nourished.  HENT:  Head: Normocephalic and atraumatic.  Eyes: Conjunctivae and EOM are normal.  Neck: Normal range of motion.  Cardiovascular: Normal rate, regular rhythm, normal heart sounds and intact distal pulses.  Pulmonary/Chest: Effort normal and breath sounds normal.  Abdominal: Soft. Bowel sounds are normal.  Lymphadenopathy:    She has no cervical adenopathy.  Neurological: She is alert and oriented to person, place, and time.  Skin: Skin is warm and dry. Capillary refill takes less than 2 seconds.  Psychiatric: She has a normal mood and affect. Her behavior is normal.  Nursing note and vitals reviewed.    Musculoskeletal Exam: C-spine limited range of motion.  Thoracic and lumbar spine limited range of motion.  She has midline spinal tenderness along her spine.  No SI joint tenderness.  Shoulder joints good range of motion discomfort.  Elbow joints, wrist joints, MCPs, PIPs, DIPs good range of motion with no synovitis.  She has mild DIP synovial thickening.  She has tenderness of bilateral CMC joints.  Hip joints good range of motion.  Knee joints, ankle joints, MTPs, PIPs, DIPs good range of motion with no synovitis.  No warmth or effusion of bilateral knees.  She has PIP and DIP synovial thickening consistent  with osteoarthritis of bilateral feet.  No tenderness of trochanteric bursa.  CDAI Exam: No CDAI exam completed.    Investigation: No additional findings. CBC Latest Ref Rng & Units 11/13/2016 04/26/2016 06/28/2015  WBC 3.8 - 10.8 Thousand/uL 8.4 8.7 8.9  Hemoglobin 11.7 - 15.5 g/dL 16.1 09.6 10.8(A)  Hematocrit 35.0 - 45.0 % 36.5 39.5 34(A)  Platelets 140 - 400 Thousand/uL 301 303 312   CMP Latest Ref Rng & Units 11/13/2016 04/26/2016 06/28/2015  Glucose 65 - 99 mg/dL 045(W) 88 -  BUN 7 - 25 mg/dL 14 15 16   Creatinine 0.50 - 0.99 mg/dL 0.98 1.19 0.9  Sodium 147 - 146 mmol/L 136 137 140  Potassium 3.5 - 5.3 mmol/L 4.9 4.8 4.6  Chloride 98 - 110 mmol/L 96(L) 97(L) -  CO2 20 - 32 mmol/L 28 28 -  Calcium 8.6 - 10.4 mg/dL 82.9 9.8 -  Total Protein 6.1 - 8.1 g/dL 6.7 6.7 -  Total Bilirubin 0.2 - 1.2 mg/dL 0.4 0.3 -  Alkaline Phos 33 - 130 U/L - 67 71  AST 10 - 35 U/L 17 17 22   ALT 6 - 29 U/L 16 16 23     Imaging: No results found.  Speciality Comments: No specialty comments available.    Procedures:  No procedures performed Allergies: Dilaudid [hydromorphone hcl]; Baclofen; Lisinopril; and Morphine and related   Assessment / Plan:     Visit Diagnoses: Fibromyalgia: She has generalized muscle tenderness and muscle tension due to fibromyalgia.  She has had increased stress at home and has been unable to exercise due to her chronic neck and lower back pain.  She continues to take Cymbalta 60 mg daily, gabapentin 300 mg 2 tablets 3 times daily, Robaxin 500 mg twice daily, and Flexeril 10 mg as needed.  She continues to have interrupted sleep but takes Restoril at bedtime.  She would like a refill of Robaxin today.  Refill sent to the pharmacy.  Patient is getting Cymbalta, temazepam and Flexeril through her PCP.  She states that she has not been taking Valium for a long time now.  Need for regular exercise was discussed.  DDD (degenerative disc disease), cervical: Chronic pain and  limited ROM.  She sees Dr. Danielle Dess on a regular basis.  DDD (degenerative disc disease), lumbar: Chronic pain.  She has limited ROM with midline spinal tenderness.  She is followed by Dr. Danielle Dess.    Other insomnia: She takes Restoril at bedtime. Good sleep hygiene was discussed.   Other fatigue: Chronic and related to insomnia.   Tendinopathy of right shoulder: She has good ROM of her right shoulder but experiences discomfort with ROM.   History of hypertension    Orders: No orders of the defined types were placed in this encounter.  No orders of the defined types were placed in this encounter.   Face-to-face time spent with patient was . >50% of time was spent in counseling and coordination of care.  Follow-Up Instructions: Return in about 6 months (around 11/16/2017) for Fibromyalgia, DDD.   Gearldine Bienenstock, PA-C   I examined and evaluated the patient with Sherron Ales PA.  Patient has no synovitis on examination we reviewed her medications today.  We will refill Robaxin.  The plan of care was discussed as noted above.  Pollyann Savoy, MD Note - This record has been created using [employer].  Chart creation errors have been sought, but may not always  have been located. Such creation errors do not reflect on  the standard of medical care.

## 2017-05-16 ENCOUNTER — Ambulatory Visit: Payer: Medicare Other | Admitting: Rheumatology

## 2017-05-16 ENCOUNTER — Encounter: Payer: Self-pay | Admitting: Rheumatology

## 2017-05-16 VITALS — BP 129/76 | HR 75 | Resp 17 | Ht 67.0 in | Wt 235.0 lb

## 2017-05-16 DIAGNOSIS — M5136 Other intervertebral disc degeneration, lumbar region: Secondary | ICD-10-CM

## 2017-05-16 DIAGNOSIS — Z8679 Personal history of other diseases of the circulatory system: Secondary | ICD-10-CM

## 2017-05-16 DIAGNOSIS — M51369 Other intervertebral disc degeneration, lumbar region without mention of lumbar back pain or lower extremity pain: Secondary | ICD-10-CM

## 2017-05-16 DIAGNOSIS — G4709 Other insomnia: Secondary | ICD-10-CM | POA: Diagnosis not present

## 2017-05-16 DIAGNOSIS — M797 Fibromyalgia: Secondary | ICD-10-CM

## 2017-05-16 DIAGNOSIS — R5383 Other fatigue: Secondary | ICD-10-CM

## 2017-05-16 DIAGNOSIS — M5030 Other cervical disc degeneration, unspecified cervical region: Secondary | ICD-10-CM

## 2017-05-16 DIAGNOSIS — M67911 Unspecified disorder of synovium and tendon, right shoulder: Secondary | ICD-10-CM

## 2017-05-16 MED ORDER — METHOCARBAMOL 500 MG PO TABS: ORAL_TABLET | ORAL | 1 refills | Status: AC

## 2017-05-16 NOTE — Patient Instructions (Signed)

## 2017-08-12 ENCOUNTER — Other Ambulatory Visit: Payer: Self-pay | Admitting: Rheumatology

## 2017-08-12 NOTE — Telephone Encounter (Signed)
Last Visit: 05/16/17 Next visit: 11/19/17  Okay to refill per Dr. Corliss Skainseveshwar

## 2017-11-05 NOTE — Progress Notes (Deleted)
Office Visit Note  Patient: Carrie [name]             Date of Birth: Apr 26, 1955           MRN: [id number]             PCP: Ignatius Specking, MD Referring: Ignatius Specking, MD Visit Date: 11/19/2017 Occupation: @GUAROCC @  Subjective:  No chief complaint on file.   History of Present Illness: Carrie [name] is a 62 y.o. female ***   Activities of Daily Living:  Patient reports morning stiffness for *** {minute/hour:19697}.   Patient {ACTIONS;DENIES/REPORTS:21021675::"Denies"} nocturnal pain.  Difficulty dressing/grooming: {ACTIONS;DENIES/REPORTS:21021675::"Denies"} Difficulty climbing stairs: {ACTIONS;DENIES/REPORTS:21021675::"Denies"} Difficulty getting out of chair: {ACTIONS;DENIES/REPORTS:21021675::"Denies"} Difficulty using hands for taps, buttons, cutlery, and/or writing: {ACTIONS;DENIES/REPORTS:21021675::"Denies"}  No Rheumatology ROS completed.   PMFS History:  Patient Active Problem List   Diagnosis Date Noted  . Fibromyalgia 11/25/2015  . DDD (degenerative disc disease), cervical 11/25/2015  . DDD (degenerative disc disease), lumbar 11/25/2015  . Tendinopathy of right shoulder 11/25/2015  . Insomnia 11/25/2015  . Fatigue 11/25/2015  . Hypertension 11/25/2015    Past Medical History:  Diagnosis Date  . Anxiety   . Arthritis   . Back pain    OPLL  . DDD (degenerative disc disease), cervical 11/25/2015  . DDD (degenerative disc disease), lumbar 11/25/2015  . Depression   . Fatigue 11/25/2015  . Fibromyalgia 11/25/2015  . Hernia   . Hypertension   . Insomnia 11/25/2015  . Rotator cuff tear   . Tendinopathy of right shoulder 11/25/2015    Family History  Problem Relation Age of Onset  . Cancer Mother        breast   . Stroke Mother   . Diabetes Father   . Osteoarthritis Father   . Diabetes Sister   . Stroke Sister   . Osteoarthritis Sister   . Healthy Daughter   . Healthy Son    Past Surgical History:  Procedure Laterality Date  . ABDOMINAL  HYSTERECTOMY    . BACK SURGERY  1989   lumb lam  . GASTRIC BYPASS  2003  . HERNIA REPAIR    . NECK SURGERY  2010   cerv fusion  . SHOULDER SURGERY  9/12   rt shoulder-DSC   Social History   Social History Narrative  . Not on file    Objective: Vital Signs: There were no vitals taken for this visit.   Physical Exam   Musculoskeletal Exam: ***  CDAI Exam: CDAI Score: Not documented Patient Global Assessment: Not documented; Provider Global Assessment: Not documented Swollen: Not documented; Tender: Not documented Joint Exam   Not documented   There is currently no information documented on the homunculus. Go to the Rheumatology activity and complete the homunculus joint exam.  Investigation: No additional findings.  Imaging: No results found.  Recent Labs: Lab Results  Component Value Date   WBC 8.4 11/13/2016   HGB 12.2 11/13/2016   PLT 301 11/13/2016   NA 136 11/13/2016   K 4.9 11/13/2016   CL 96 (L) 11/13/2016   CO2 28 11/13/2016   GLUCOSE 102 (H) 11/13/2016   BUN 14 11/13/2016   CREATININE 0.84 11/13/2016   BILITOT 0.4 11/13/2016   ALKPHOS 67 04/26/2016   AST 17 11/13/2016   ALT 16 11/13/2016   PROT 6.7 11/13/2016   ALBUMIN 4.2 04/26/2016   CALCIUM 10.4 11/13/2016   GFRAA 88 11/13/2016    Speciality Comments: No specialty comments available.  Procedures:  No procedures performed Allergies: Dilaudid [hydromorphone hcl]; Baclofen; Lisinopril; and Morphine and related   Assessment / Plan:     Visit Diagnoses: Fibromyalgia - Cymbalta 60 mg daily, gabapentin 300 mg 2 tablets 3 times daily, Robaxin 500 mg twice daily, and Flexeril 10 mg as needed  DDD (degenerative disc disease), cervical  DDD (degenerative disc disease), lumbar - Dr. Danielle Dess  Other insomnia - Restoril at bedtime  Other fatigue  Tendinopathy of right shoulder  History of hypertension  Trapezius muscle spasm   Orders: No orders of the defined types were placed in this  encounter.  No orders of the defined types were placed in this encounter.   Face-to-face time spent with patient was *** minutes. Greater than 50% of time was spent in counseling and coordination of care.  Follow-Up Instructions: No follow-ups on file.   Gearldine Bienenstock, PA-C  Note - This record has been created using [employer] software.  Chart creation errors have been sought, but may not always  have been located. Such creation errors do not reflect on  the standard of medical care.

## 2017-11-19 ENCOUNTER — Ambulatory Visit: Payer: Medicare Other | Admitting: Physician Assistant

## 2017-12-02 ENCOUNTER — Other Ambulatory Visit: Payer: Self-pay | Admitting: Rheumatology

## 2017-12-02 NOTE — Telephone Encounter (Signed)
Please schedule patient for a follow up visit. Patient due November 2019. Thanks! 

## 2017-12-02 NOTE — Telephone Encounter (Signed)
Last Visit: 05/16/17 Next visit: due November 2019. Message sent to the front to schedule patient   Okay to refill per Dr. Corliss Skainseveshwar

## 2017-12-02 NOTE — Telephone Encounter (Signed)
LMOM for patient to call and schedule follow-up appointment.   °

## 2018-03-15 ENCOUNTER — Other Ambulatory Visit: Payer: Self-pay | Admitting: Rheumatology

## 2018-03-17 NOTE — Telephone Encounter (Signed)
LMOM for patient to call and schedule follow-up appointment.   °

## 2018-03-17 NOTE — Telephone Encounter (Signed)
Please schedule patient for a follow up visit. Patient was due November 2019. Thanks!  

## 2018-03-17 NOTE — Telephone Encounter (Signed)
Last Visit: 05/16/17 Next visit: due November 2019. Message sent to the front to schedule patient   Okay to refill 30 day supply  per Dr. Corliss Skains

## 2018-06-04 ENCOUNTER — Other Ambulatory Visit: Payer: Self-pay | Admitting: Rheumatology

## 2018-10-24 ENCOUNTER — Other Ambulatory Visit: Payer: Self-pay | Admitting: Neurological Surgery

## 2018-10-24 ENCOUNTER — Other Ambulatory Visit (HOSPITAL_COMMUNITY): Payer: Self-pay | Admitting: Neurological Surgery

## 2018-10-24 DIAGNOSIS — M48062 Spinal stenosis, lumbar region with neurogenic claudication: Secondary | ICD-10-CM

## 2018-11-03 ENCOUNTER — Other Ambulatory Visit (HOSPITAL_COMMUNITY): Payer: Self-pay | Admitting: Internal Medicine

## 2018-11-03 DIAGNOSIS — Z1231 Encounter for screening mammogram for malignant neoplasm of breast: Secondary | ICD-10-CM

## 2018-11-07 ENCOUNTER — Ambulatory Visit (HOSPITAL_COMMUNITY)
Admission: RE | Admit: 2018-11-07 | Discharge: 2018-11-07 | Disposition: A | Discharge status: Home / Self Care | Payer: Medicare Other | Source: Ambulatory Visit | Attending: Neurological Surgery | Admitting: Neurological Surgery

## 2018-11-07 ENCOUNTER — Other Ambulatory Visit: Payer: Self-pay

## 2018-11-07 DIAGNOSIS — M48062 Spinal stenosis, lumbar region with neurogenic claudication: Secondary | ICD-10-CM | POA: Insufficient documentation

## 2018-11-07 LAB — POCT I-STAT CREATININE: Creatinine, Ser: 0.9 mg/dL (ref 0.44–1.00)

## 2018-11-07 MED ORDER — GADOBUTROL 1 MMOL/ML IV SOLN
10.0000 mL | Freq: Once | INTRAVENOUS | Status: AC | PRN
  Administered 2018-11-07: 12:00:00 10 mL via INTRAVENOUS

## 2018-11-14 ENCOUNTER — Ambulatory Visit (HOSPITAL_COMMUNITY): Payer: 59

## 2019-03-18 DIAGNOSIS — Z789 Other specified health status: Secondary | ICD-10-CM | POA: Diagnosis not present

## 2019-03-18 DIAGNOSIS — Z299 Encounter for prophylactic measures, unspecified: Secondary | ICD-10-CM | POA: Diagnosis not present

## 2019-03-18 DIAGNOSIS — I10 Essential (primary) hypertension: Secondary | ICD-10-CM | POA: Diagnosis not present

## 2019-05-13 DIAGNOSIS — M48062 Spinal stenosis, lumbar region with neurogenic claudication: Secondary | ICD-10-CM | POA: Diagnosis not present

## 2019-09-01 DIAGNOSIS — E1165 Type 2 diabetes mellitus with hyperglycemia: Secondary | ICD-10-CM | POA: Diagnosis not present

## 2019-09-01 DIAGNOSIS — Z299 Encounter for prophylactic measures, unspecified: Secondary | ICD-10-CM | POA: Diagnosis not present

## 2019-11-11 DIAGNOSIS — M5412 Radiculopathy, cervical region: Secondary | ICD-10-CM | POA: Diagnosis not present

## 2019-12-09 DIAGNOSIS — I10 Essential (primary) hypertension: Secondary | ICD-10-CM | POA: Diagnosis not present

## 2019-12-09 DIAGNOSIS — G4700 Insomnia, unspecified: Secondary | ICD-10-CM | POA: Diagnosis not present

## 2019-12-09 DIAGNOSIS — Z299 Encounter for prophylactic measures, unspecified: Secondary | ICD-10-CM | POA: Diagnosis not present

## 2019-12-09 DIAGNOSIS — M797 Fibromyalgia: Secondary | ICD-10-CM | POA: Diagnosis not present
